# Patient Record
Sex: Female | Born: 1975 | Race: White | Hispanic: No | Marital: Single | Smoking: Current every day smoker
Health system: Southern US, Community
[De-identification: ages and names within clinical notes are randomized; demographics above are authoritative.]

## PROBLEM LIST (undated history)

## (undated) ENCOUNTER — Inpatient Hospital Stay: Payer: Self-pay

## (undated) DIAGNOSIS — I10 Essential (primary) hypertension: Secondary | ICD-10-CM

## (undated) DIAGNOSIS — K3532 Acute appendicitis with perforation and localized peritonitis, without abscess: Secondary | ICD-10-CM

## (undated) DIAGNOSIS — G9520 Unspecified cord compression: Secondary | ICD-10-CM

## (undated) DIAGNOSIS — K37 Unspecified appendicitis: Secondary | ICD-10-CM

## (undated) DIAGNOSIS — I2510 Atherosclerotic heart disease of native coronary artery without angina pectoris: Secondary | ICD-10-CM

## (undated) DIAGNOSIS — G8929 Other chronic pain: Secondary | ICD-10-CM

## (undated) DIAGNOSIS — F32A Depression, unspecified: Secondary | ICD-10-CM

## (undated) DIAGNOSIS — E119 Type 2 diabetes mellitus without complications: Secondary | ICD-10-CM

## (undated) DIAGNOSIS — F329 Major depressive disorder, single episode, unspecified: Secondary | ICD-10-CM

## (undated) DIAGNOSIS — F1410 Cocaine abuse, uncomplicated: Secondary | ICD-10-CM

## (undated) DIAGNOSIS — I219 Acute myocardial infarction, unspecified: Secondary | ICD-10-CM

## (undated) DIAGNOSIS — F419 Anxiety disorder, unspecified: Secondary | ICD-10-CM

## (undated) DIAGNOSIS — Z955 Presence of coronary angioplasty implant and graft: Secondary | ICD-10-CM

## (undated) DIAGNOSIS — M5116 Intervertebral disc disorders with radiculopathy, lumbar region: Secondary | ICD-10-CM

## (undated) HISTORY — DX: Type 2 diabetes mellitus without complications: E11.9

## (undated) HISTORY — DX: Unspecified appendicitis: K37

## (undated) HISTORY — PX: OTHER SURGICAL HISTORY: SHX169

## (undated) HISTORY — DX: Acute appendicitis with perforation, localized peritonitis, and gangrene, without abscess: K35.32

## (undated) HISTORY — DX: Atherosclerotic heart disease of native coronary artery without angina pectoris: I25.10

## (undated) HISTORY — DX: Other chronic pain: G89.29

## (undated) HISTORY — DX: Cocaine abuse, uncomplicated: F14.10

## (undated) HISTORY — DX: Acute myocardial infarction, unspecified: I21.9

## (undated) HISTORY — DX: Unspecified cord compression: G95.20

## (undated) HISTORY — PX: DILATION AND CURETTAGE OF UTERUS: SHX78

## (undated) HISTORY — DX: Intervertebral disc disorders with radiculopathy, lumbar region: M51.16

## (undated) HISTORY — PX: GALLBLADDER SURGERY: SHX652

## (undated) HISTORY — PX: CORONARY ANGIOPLASTY WITH STENT PLACEMENT: SHX49

## (undated) HISTORY — DX: Presence of coronary angioplasty implant and graft: Z95.5

## (undated) HISTORY — PX: TONSILLECTOMY: SUR1361

---

## 2004-04-29 ENCOUNTER — Emergency Department: Payer: Self-pay | Admitting: Emergency Medicine

## 2004-12-03 ENCOUNTER — Emergency Department: Payer: Self-pay | Admitting: General Practice

## 2006-10-10 ENCOUNTER — Emergency Department: Payer: Self-pay | Admitting: Emergency Medicine

## 2007-05-22 ENCOUNTER — Emergency Department: Payer: Self-pay | Admitting: Emergency Medicine

## 2007-05-25 ENCOUNTER — Emergency Department: Payer: Self-pay | Admitting: Emergency Medicine

## 2007-11-28 ENCOUNTER — Emergency Department: Payer: Self-pay | Admitting: Emergency Medicine

## 2007-12-23 ENCOUNTER — Emergency Department: Payer: Self-pay | Admitting: Emergency Medicine

## 2008-02-08 ENCOUNTER — Emergency Department: Payer: Self-pay | Admitting: Internal Medicine

## 2008-05-13 ENCOUNTER — Emergency Department: Payer: Self-pay | Admitting: Emergency Medicine

## 2009-02-12 ENCOUNTER — Inpatient Hospital Stay: Payer: Self-pay | Admitting: Cardiology

## 2009-04-18 ENCOUNTER — Emergency Department: Payer: Self-pay | Admitting: Emergency Medicine

## 2009-06-17 ENCOUNTER — Encounter: Payer: Self-pay | Admitting: Orthopaedic Surgery

## 2009-06-23 ENCOUNTER — Emergency Department: Payer: Self-pay | Admitting: Unknown Physician Specialty

## 2009-06-24 ENCOUNTER — Encounter: Payer: Self-pay | Admitting: Orthopaedic Surgery

## 2009-07-22 ENCOUNTER — Encounter: Payer: Self-pay | Admitting: Orthopaedic Surgery

## 2009-10-24 ENCOUNTER — Emergency Department: Payer: Self-pay | Admitting: Unknown Physician Specialty

## 2010-09-02 ENCOUNTER — Emergency Department: Payer: Self-pay | Admitting: Emergency Medicine

## 2011-08-09 ENCOUNTER — Emergency Department: Payer: Self-pay | Admitting: Emergency Medicine

## 2011-08-09 LAB — CBC WITH DIFFERENTIAL/PLATELET
Basophil #: 0.1 10*3/uL (ref 0.0–0.1)
Eosinophil %: 2.6 %
HCT: 44.2 % (ref 35.0–47.0)
Lymphocyte #: 4.7 10*3/uL — ABNORMAL HIGH (ref 1.0–3.6)
Lymphocyte %: 37.3 %
MCH: 32 pg (ref 26.0–34.0)
MCV: 96 fL (ref 80–100)
Monocyte #: 0.4 10*3/uL (ref 0.0–0.7)
Monocyte %: 3.5 %
Neutrophil #: 7 10*3/uL — ABNORMAL HIGH (ref 1.4–6.5)
Platelet: 230 10*3/uL (ref 150–440)
RBC: 4.62 10*6/uL (ref 3.80–5.20)
WBC: 12.6 10*3/uL — ABNORMAL HIGH (ref 3.6–11.0)

## 2011-08-09 LAB — PREGNANCY, URINE: Pregnancy Test, Urine: NEGATIVE m[IU]/mL

## 2011-08-09 LAB — COMPREHENSIVE METABOLIC PANEL
Albumin: 4 g/dL (ref 3.4–5.0)
Alkaline Phosphatase: 60 U/L (ref 50–136)
Anion Gap: 12 (ref 7–16)
BUN: 10 mg/dL (ref 7–18)
Calcium, Total: 9 mg/dL (ref 8.5–10.1)
Creatinine: 0.78 mg/dL (ref 0.60–1.30)
Glucose: 287 mg/dL — ABNORMAL HIGH (ref 65–99)
Osmolality: 287 (ref 275–301)
Potassium: 4.3 mmol/L (ref 3.5–5.1)
SGPT (ALT): 28 U/L
Sodium: 139 mmol/L (ref 136–145)
Total Protein: 7.8 g/dL (ref 6.4–8.2)

## 2011-08-09 LAB — URINALYSIS, COMPLETE
Glucose,UR: >=500 mg/dL (ref 0–75)
Ketone: NEGATIVE
Leukocyte Esterase: NEGATIVE
Nitrite: NEGATIVE
Ph: 6 (ref 4.5–8.0)
Protein: NEGATIVE
Specific Gravity: 1.015 (ref 1.003–1.030)
WBC UR: 1 /HPF (ref 0–5)

## 2012-03-08 ENCOUNTER — Emergency Department: Payer: Self-pay | Admitting: Emergency Medicine

## 2012-03-13 ENCOUNTER — Emergency Department: Payer: Self-pay | Admitting: Emergency Medicine

## 2013-04-09 DIAGNOSIS — M489 Spondylopathy, unspecified: Secondary | ICD-10-CM | POA: Insufficient documentation

## 2013-04-09 DIAGNOSIS — E119 Type 2 diabetes mellitus without complications: Secondary | ICD-10-CM | POA: Insufficient documentation

## 2013-04-09 DIAGNOSIS — I219 Acute myocardial infarction, unspecified: Secondary | ICD-10-CM

## 2013-04-09 DIAGNOSIS — Z955 Presence of coronary angioplasty implant and graft: Secondary | ICD-10-CM

## 2013-04-09 HISTORY — DX: Type 2 diabetes mellitus without complications: E11.9

## 2013-04-09 HISTORY — DX: Presence of coronary angioplasty implant and graft: Z95.5

## 2013-04-09 HISTORY — DX: Acute myocardial infarction, unspecified: I21.9

## 2013-06-14 DIAGNOSIS — Z720 Tobacco use: Secondary | ICD-10-CM | POA: Insufficient documentation

## 2013-06-14 DIAGNOSIS — F17200 Nicotine dependence, unspecified, uncomplicated: Secondary | ICD-10-CM | POA: Insufficient documentation

## 2013-06-15 DIAGNOSIS — M4802 Spinal stenosis, cervical region: Secondary | ICD-10-CM | POA: Insufficient documentation

## 2013-07-04 DIAGNOSIS — Z981 Arthrodesis status: Secondary | ICD-10-CM | POA: Insufficient documentation

## 2013-07-04 DIAGNOSIS — G9520 Unspecified cord compression: Secondary | ICD-10-CM | POA: Insufficient documentation

## 2013-07-04 DIAGNOSIS — G959 Disease of spinal cord, unspecified: Secondary | ICD-10-CM | POA: Insufficient documentation

## 2013-07-04 HISTORY — DX: Unspecified cord compression: G95.20

## 2013-11-28 DIAGNOSIS — M5090 Cervical disc disorder, unspecified, unspecified cervical region: Secondary | ICD-10-CM | POA: Insufficient documentation

## 2013-11-28 DIAGNOSIS — G8929 Other chronic pain: Secondary | ICD-10-CM

## 2013-11-28 HISTORY — DX: Other chronic pain: G89.29

## 2013-11-30 DIAGNOSIS — M47816 Spondylosis without myelopathy or radiculopathy, lumbar region: Secondary | ICD-10-CM | POA: Insufficient documentation

## 2013-11-30 DIAGNOSIS — M5116 Intervertebral disc disorders with radiculopathy, lumbar region: Secondary | ICD-10-CM

## 2013-11-30 DIAGNOSIS — F419 Anxiety disorder, unspecified: Secondary | ICD-10-CM | POA: Insufficient documentation

## 2013-11-30 HISTORY — DX: Intervertebral disc disorders with radiculopathy, lumbar region: M51.16

## 2014-06-21 DIAGNOSIS — M4696 Unspecified inflammatory spondylopathy, lumbar region: Secondary | ICD-10-CM | POA: Diagnosis not present

## 2014-06-21 DIAGNOSIS — M5090 Cervical disc disorder, unspecified, unspecified cervical region: Secondary | ICD-10-CM | POA: Diagnosis not present

## 2014-06-21 DIAGNOSIS — M5116 Intervertebral disc disorders with radiculopathy, lumbar region: Secondary | ICD-10-CM | POA: Diagnosis not present

## 2014-06-21 DIAGNOSIS — G894 Chronic pain syndrome: Secondary | ICD-10-CM | POA: Diagnosis not present

## 2014-06-21 DIAGNOSIS — F419 Anxiety disorder, unspecified: Secondary | ICD-10-CM | POA: Diagnosis not present

## 2014-06-21 DIAGNOSIS — F329 Major depressive disorder, single episode, unspecified: Secondary | ICD-10-CM | POA: Diagnosis not present

## 2014-08-07 DIAGNOSIS — Z3A00 Weeks of gestation of pregnancy not specified: Secondary | ICD-10-CM | POA: Diagnosis not present

## 2014-08-07 DIAGNOSIS — N898 Other specified noninflammatory disorders of vagina: Secondary | ICD-10-CM | POA: Diagnosis not present

## 2014-08-07 DIAGNOSIS — O3481 Maternal care for other abnormalities of pelvic organs, first trimester: Secondary | ICD-10-CM | POA: Diagnosis not present

## 2014-08-07 DIAGNOSIS — Z3A01 Less than 8 weeks gestation of pregnancy: Secondary | ICD-10-CM | POA: Diagnosis not present

## 2014-08-07 DIAGNOSIS — F17210 Nicotine dependence, cigarettes, uncomplicated: Secondary | ICD-10-CM | POA: Diagnosis not present

## 2014-08-07 DIAGNOSIS — O9989 Other specified diseases and conditions complicating pregnancy, childbirth and the puerperium: Secondary | ICD-10-CM | POA: Diagnosis not present

## 2014-08-07 DIAGNOSIS — E119 Type 2 diabetes mellitus without complications: Secondary | ICD-10-CM | POA: Diagnosis not present

## 2014-08-07 DIAGNOSIS — O26891 Other specified pregnancy related conditions, first trimester: Secondary | ICD-10-CM | POA: Diagnosis not present

## 2014-08-07 DIAGNOSIS — R1030 Lower abdominal pain, unspecified: Secondary | ICD-10-CM | POA: Diagnosis not present

## 2014-08-14 LAB — OB RESULTS CONSOLE HEPATITIS B SURFACE ANTIGEN: HEP B S AG: NEGATIVE

## 2014-08-14 LAB — OB RESULTS CONSOLE VARICELLA ZOSTER ANTIBODY, IGG: VARICELLA IGG: IMMUNE

## 2014-08-14 LAB — OB RESULTS CONSOLE RUBELLA ANTIBODY, IGM: Rubella: IMMUNE

## 2014-08-14 LAB — OB RESULTS CONSOLE HIV ANTIBODY (ROUTINE TESTING): HIV: NONREACTIVE

## 2014-08-14 LAB — OB RESULTS CONSOLE RPR: RPR: NONREACTIVE

## 2014-08-14 LAB — OB RESULTS CONSOLE ABO/RH: RH Type: POSITIVE

## 2014-08-26 DIAGNOSIS — Z3A10 10 weeks gestation of pregnancy: Secondary | ICD-10-CM | POA: Diagnosis not present

## 2014-08-26 DIAGNOSIS — O09522 Supervision of elderly multigravida, second trimester: Secondary | ICD-10-CM | POA: Diagnosis not present

## 2014-08-27 DIAGNOSIS — O0990 Supervision of high risk pregnancy, unspecified, unspecified trimester: Secondary | ICD-10-CM | POA: Diagnosis not present

## 2014-08-29 DIAGNOSIS — O9989 Other specified diseases and conditions complicating pregnancy, childbirth and the puerperium: Secondary | ICD-10-CM | POA: Diagnosis not present

## 2014-08-29 DIAGNOSIS — Z118 Encounter for screening for other infectious and parasitic diseases: Secondary | ICD-10-CM | POA: Diagnosis not present

## 2014-08-29 DIAGNOSIS — Z3A11 11 weeks gestation of pregnancy: Secondary | ICD-10-CM | POA: Diagnosis not present

## 2014-08-29 DIAGNOSIS — Z8659 Personal history of other mental and behavioral disorders: Secondary | ICD-10-CM | POA: Diagnosis not present

## 2014-08-29 DIAGNOSIS — Z7982 Long term (current) use of aspirin: Secondary | ICD-10-CM | POA: Diagnosis not present

## 2014-08-29 DIAGNOSIS — O99351 Diseases of the nervous system complicating pregnancy, first trimester: Secondary | ICD-10-CM | POA: Diagnosis not present

## 2014-08-29 DIAGNOSIS — Z3A12 12 weeks gestation of pregnancy: Secondary | ICD-10-CM | POA: Diagnosis not present

## 2014-08-29 DIAGNOSIS — N898 Other specified noninflammatory disorders of vagina: Secondary | ICD-10-CM | POA: Diagnosis not present

## 2014-08-29 DIAGNOSIS — O09521 Supervision of elderly multigravida, first trimester: Secondary | ICD-10-CM | POA: Diagnosis not present

## 2014-08-29 DIAGNOSIS — O161 Unspecified maternal hypertension, first trimester: Secondary | ICD-10-CM | POA: Diagnosis not present

## 2014-08-29 DIAGNOSIS — G8929 Other chronic pain: Secondary | ICD-10-CM | POA: Diagnosis not present

## 2014-08-29 DIAGNOSIS — I252 Old myocardial infarction: Secondary | ICD-10-CM | POA: Insufficient documentation

## 2014-08-29 DIAGNOSIS — O24111 Pre-existing diabetes mellitus, type 2, in pregnancy, first trimester: Secondary | ICD-10-CM | POA: Diagnosis not present

## 2014-08-29 DIAGNOSIS — Z113 Encounter for screening for infections with a predominantly sexual mode of transmission: Secondary | ICD-10-CM | POA: Diagnosis not present

## 2014-08-29 DIAGNOSIS — E119 Type 2 diabetes mellitus without complications: Secondary | ICD-10-CM | POA: Diagnosis not present

## 2014-08-29 DIAGNOSIS — O99411 Diseases of the circulatory system complicating pregnancy, first trimester: Secondary | ICD-10-CM | POA: Diagnosis not present

## 2014-08-30 DIAGNOSIS — M5116 Intervertebral disc disorders with radiculopathy, lumbar region: Secondary | ICD-10-CM | POA: Diagnosis not present

## 2014-08-30 DIAGNOSIS — M4696 Unspecified inflammatory spondylopathy, lumbar region: Secondary | ICD-10-CM | POA: Diagnosis not present

## 2014-08-30 DIAGNOSIS — G894 Chronic pain syndrome: Secondary | ICD-10-CM | POA: Diagnosis not present

## 2014-09-03 DIAGNOSIS — E119 Type 2 diabetes mellitus without complications: Secondary | ICD-10-CM | POA: Diagnosis not present

## 2014-09-03 DIAGNOSIS — I2510 Atherosclerotic heart disease of native coronary artery without angina pectoris: Secondary | ICD-10-CM | POA: Diagnosis not present

## 2014-09-03 DIAGNOSIS — Z3A12 12 weeks gestation of pregnancy: Secondary | ICD-10-CM | POA: Diagnosis not present

## 2014-09-03 DIAGNOSIS — Z3A11 11 weeks gestation of pregnancy: Secondary | ICD-10-CM | POA: Diagnosis not present

## 2014-09-03 DIAGNOSIS — I252 Old myocardial infarction: Secondary | ICD-10-CM | POA: Diagnosis not present

## 2014-09-03 DIAGNOSIS — F419 Anxiety disorder, unspecified: Secondary | ICD-10-CM | POA: Diagnosis not present

## 2014-09-03 DIAGNOSIS — O200 Threatened abortion: Secondary | ICD-10-CM | POA: Diagnosis not present

## 2014-09-03 DIAGNOSIS — Z3A01 Less than 8 weeks gestation of pregnancy: Secondary | ICD-10-CM | POA: Diagnosis not present

## 2014-09-03 DIAGNOSIS — O208 Other hemorrhage in early pregnancy: Secondary | ICD-10-CM | POA: Diagnosis not present

## 2014-09-03 DIAGNOSIS — R102 Pelvic and perineal pain: Secondary | ICD-10-CM | POA: Diagnosis not present

## 2014-09-03 DIAGNOSIS — F17210 Nicotine dependence, cigarettes, uncomplicated: Secondary | ICD-10-CM | POA: Diagnosis not present

## 2014-09-03 DIAGNOSIS — I10 Essential (primary) hypertension: Secondary | ICD-10-CM | POA: Diagnosis not present

## 2014-09-04 DIAGNOSIS — E119 Type 2 diabetes mellitus without complications: Secondary | ICD-10-CM | POA: Diagnosis not present

## 2014-09-04 DIAGNOSIS — O99351 Diseases of the nervous system complicating pregnancy, first trimester: Secondary | ICD-10-CM | POA: Diagnosis not present

## 2014-09-04 DIAGNOSIS — O09521 Supervision of elderly multigravida, first trimester: Secondary | ICD-10-CM | POA: Diagnosis not present

## 2014-09-04 DIAGNOSIS — O10019 Pre-existing essential hypertension complicating pregnancy, unspecified trimester: Secondary | ICD-10-CM | POA: Insufficient documentation

## 2014-09-04 DIAGNOSIS — O10919 Unspecified pre-existing hypertension complicating pregnancy, unspecified trimester: Secondary | ICD-10-CM | POA: Insufficient documentation

## 2014-09-04 DIAGNOSIS — O09519 Supervision of elderly primigravida, unspecified trimester: Secondary | ICD-10-CM | POA: Insufficient documentation

## 2014-09-04 DIAGNOSIS — G8921 Chronic pain due to trauma: Secondary | ICD-10-CM | POA: Insufficient documentation

## 2014-09-04 DIAGNOSIS — I252 Old myocardial infarction: Secondary | ICD-10-CM | POA: Diagnosis not present

## 2014-09-04 DIAGNOSIS — O99411 Diseases of the circulatory system complicating pregnancy, first trimester: Secondary | ICD-10-CM | POA: Diagnosis not present

## 2014-09-04 DIAGNOSIS — O24111 Pre-existing diabetes mellitus, type 2, in pregnancy, first trimester: Secondary | ICD-10-CM | POA: Diagnosis not present

## 2014-10-02 DIAGNOSIS — I2510 Atherosclerotic heart disease of native coronary artery without angina pectoris: Secondary | ICD-10-CM

## 2014-10-02 DIAGNOSIS — Z955 Presence of coronary angioplasty implant and graft: Secondary | ICD-10-CM | POA: Insufficient documentation

## 2014-10-02 HISTORY — DX: Atherosclerotic heart disease of native coronary artery without angina pectoris: I25.10

## 2014-10-03 DIAGNOSIS — O99332 Smoking (tobacco) complicating pregnancy, second trimester: Secondary | ICD-10-CM | POA: Diagnosis not present

## 2014-10-03 DIAGNOSIS — Z7982 Long term (current) use of aspirin: Secondary | ICD-10-CM | POA: Diagnosis not present

## 2014-10-03 DIAGNOSIS — F329 Major depressive disorder, single episode, unspecified: Secondary | ICD-10-CM | POA: Diagnosis not present

## 2014-10-03 DIAGNOSIS — I2510 Atherosclerotic heart disease of native coronary artery without angina pectoris: Secondary | ICD-10-CM | POA: Diagnosis not present

## 2014-10-03 DIAGNOSIS — O99352 Diseases of the nervous system complicating pregnancy, second trimester: Secondary | ICD-10-CM | POA: Diagnosis not present

## 2014-10-03 DIAGNOSIS — Z794 Long term (current) use of insulin: Secondary | ICD-10-CM | POA: Diagnosis not present

## 2014-10-03 DIAGNOSIS — O99342 Other mental disorders complicating pregnancy, second trimester: Secondary | ICD-10-CM | POA: Diagnosis not present

## 2014-10-03 DIAGNOSIS — O162 Unspecified maternal hypertension, second trimester: Secondary | ICD-10-CM | POA: Diagnosis not present

## 2014-10-03 DIAGNOSIS — G8929 Other chronic pain: Secondary | ICD-10-CM | POA: Diagnosis not present

## 2014-10-03 DIAGNOSIS — I252 Old myocardial infarction: Secondary | ICD-10-CM | POA: Diagnosis not present

## 2014-10-03 DIAGNOSIS — O09522 Supervision of elderly multigravida, second trimester: Secondary | ICD-10-CM | POA: Diagnosis not present

## 2014-10-03 DIAGNOSIS — F419 Anxiety disorder, unspecified: Secondary | ICD-10-CM | POA: Diagnosis not present

## 2014-10-03 DIAGNOSIS — E119 Type 2 diabetes mellitus without complications: Secondary | ICD-10-CM | POA: Diagnosis not present

## 2014-10-03 DIAGNOSIS — O99412 Diseases of the circulatory system complicating pregnancy, second trimester: Secondary | ICD-10-CM | POA: Diagnosis not present

## 2014-10-03 DIAGNOSIS — Z3A16 16 weeks gestation of pregnancy: Secondary | ICD-10-CM | POA: Diagnosis not present

## 2014-10-03 DIAGNOSIS — F17210 Nicotine dependence, cigarettes, uncomplicated: Secondary | ICD-10-CM | POA: Diagnosis not present

## 2014-10-03 DIAGNOSIS — O24112 Pre-existing diabetes mellitus, type 2, in pregnancy, second trimester: Secondary | ICD-10-CM | POA: Diagnosis not present

## 2014-10-10 DIAGNOSIS — Z3A17 17 weeks gestation of pregnancy: Secondary | ICD-10-CM | POA: Diagnosis not present

## 2014-10-10 DIAGNOSIS — O99342 Other mental disorders complicating pregnancy, second trimester: Secondary | ICD-10-CM | POA: Diagnosis not present

## 2014-10-10 DIAGNOSIS — I252 Old myocardial infarction: Secondary | ICD-10-CM | POA: Diagnosis not present

## 2014-10-10 DIAGNOSIS — O26892 Other specified pregnancy related conditions, second trimester: Secondary | ICD-10-CM | POA: Diagnosis not present

## 2014-10-10 DIAGNOSIS — Z7982 Long term (current) use of aspirin: Secondary | ICD-10-CM | POA: Diagnosis not present

## 2014-10-10 DIAGNOSIS — E119 Type 2 diabetes mellitus without complications: Secondary | ICD-10-CM | POA: Diagnosis not present

## 2014-10-10 DIAGNOSIS — O24912 Unspecified diabetes mellitus in pregnancy, second trimester: Secondary | ICD-10-CM | POA: Diagnosis not present

## 2014-10-10 DIAGNOSIS — O162 Unspecified maternal hypertension, second trimester: Secondary | ICD-10-CM | POA: Diagnosis not present

## 2014-10-10 DIAGNOSIS — O09522 Supervision of elderly multigravida, second trimester: Secondary | ICD-10-CM | POA: Diagnosis not present

## 2014-10-10 DIAGNOSIS — F419 Anxiety disorder, unspecified: Secondary | ICD-10-CM | POA: Diagnosis not present

## 2014-10-10 DIAGNOSIS — G8929 Other chronic pain: Secondary | ICD-10-CM | POA: Diagnosis not present

## 2014-10-10 DIAGNOSIS — I2510 Atherosclerotic heart disease of native coronary artery without angina pectoris: Secondary | ICD-10-CM | POA: Diagnosis not present

## 2014-10-10 DIAGNOSIS — O99332 Smoking (tobacco) complicating pregnancy, second trimester: Secondary | ICD-10-CM | POA: Diagnosis not present

## 2014-10-10 DIAGNOSIS — Z794 Long term (current) use of insulin: Secondary | ICD-10-CM | POA: Diagnosis not present

## 2014-10-10 DIAGNOSIS — Z79891 Long term (current) use of opiate analgesic: Secondary | ICD-10-CM | POA: Diagnosis not present

## 2014-10-10 DIAGNOSIS — Z955 Presence of coronary angioplasty implant and graft: Secondary | ICD-10-CM | POA: Diagnosis not present

## 2014-10-10 DIAGNOSIS — O99412 Diseases of the circulatory system complicating pregnancy, second trimester: Secondary | ICD-10-CM | POA: Diagnosis not present

## 2014-10-10 DIAGNOSIS — F329 Major depressive disorder, single episode, unspecified: Secondary | ICD-10-CM | POA: Diagnosis not present

## 2014-10-11 DIAGNOSIS — R7989 Other specified abnormal findings of blood chemistry: Secondary | ICD-10-CM | POA: Insufficient documentation

## 2014-10-11 DIAGNOSIS — R799 Abnormal finding of blood chemistry, unspecified: Secondary | ICD-10-CM | POA: Insufficient documentation

## 2014-10-22 DIAGNOSIS — O358XX0 Maternal care for other (suspected) fetal abnormality and damage, not applicable or unspecified: Secondary | ICD-10-CM | POA: Insufficient documentation

## 2014-10-22 DIAGNOSIS — F419 Anxiety disorder, unspecified: Secondary | ICD-10-CM

## 2014-10-22 DIAGNOSIS — O09519 Supervision of elderly primigravida, unspecified trimester: Secondary | ICD-10-CM | POA: Diagnosis not present

## 2014-10-22 DIAGNOSIS — O10912 Unspecified pre-existing hypertension complicating pregnancy, second trimester: Secondary | ICD-10-CM | POA: Diagnosis not present

## 2014-10-22 DIAGNOSIS — F329 Major depressive disorder, single episode, unspecified: Secondary | ICD-10-CM | POA: Insufficient documentation

## 2014-10-22 DIAGNOSIS — O2620 Pregnancy care for patient with recurrent pregnancy loss, unspecified trimester: Secondary | ICD-10-CM | POA: Insufficient documentation

## 2014-10-22 DIAGNOSIS — E1169 Type 2 diabetes mellitus with other specified complication: Secondary | ICD-10-CM | POA: Diagnosis not present

## 2014-10-22 DIAGNOSIS — G8921 Chronic pain due to trauma: Secondary | ICD-10-CM | POA: Diagnosis not present

## 2014-10-22 DIAGNOSIS — O99330 Smoking (tobacco) complicating pregnancy, unspecified trimester: Secondary | ICD-10-CM | POA: Insufficient documentation

## 2014-10-23 DIAGNOSIS — R7989 Other specified abnormal findings of blood chemistry: Secondary | ICD-10-CM | POA: Diagnosis not present

## 2014-11-06 DIAGNOSIS — M4696 Unspecified inflammatory spondylopathy, lumbar region: Secondary | ICD-10-CM | POA: Diagnosis not present

## 2014-11-06 DIAGNOSIS — M5116 Intervertebral disc disorders with radiculopathy, lumbar region: Secondary | ICD-10-CM | POA: Diagnosis not present

## 2014-11-21 DIAGNOSIS — O358XX0 Maternal care for other (suspected) fetal abnormality and damage, not applicable or unspecified: Secondary | ICD-10-CM | POA: Diagnosis not present

## 2014-11-21 DIAGNOSIS — G8921 Chronic pain due to trauma: Secondary | ICD-10-CM | POA: Diagnosis not present

## 2014-11-21 DIAGNOSIS — O99330 Smoking (tobacco) complicating pregnancy, unspecified trimester: Secondary | ICD-10-CM | POA: Diagnosis not present

## 2014-11-21 DIAGNOSIS — O10919 Unspecified pre-existing hypertension complicating pregnancy, unspecified trimester: Secondary | ICD-10-CM | POA: Diagnosis not present

## 2014-11-21 DIAGNOSIS — R7989 Other specified abnormal findings of blood chemistry: Secondary | ICD-10-CM | POA: Diagnosis not present

## 2014-11-21 DIAGNOSIS — E119 Type 2 diabetes mellitus without complications: Secondary | ICD-10-CM | POA: Diagnosis not present

## 2014-11-21 DIAGNOSIS — O2620 Pregnancy care for patient with recurrent pregnancy loss, unspecified trimester: Secondary | ICD-10-CM | POA: Diagnosis not present

## 2014-11-21 DIAGNOSIS — Z3A23 23 weeks gestation of pregnancy: Secondary | ICD-10-CM | POA: Diagnosis not present

## 2014-11-21 DIAGNOSIS — I252 Old myocardial infarction: Secondary | ICD-10-CM | POA: Diagnosis not present

## 2014-11-21 DIAGNOSIS — O09519 Supervision of elderly primigravida, unspecified trimester: Secondary | ICD-10-CM | POA: Diagnosis not present

## 2014-12-13 DIAGNOSIS — O2622 Pregnancy care for patient with recurrent pregnancy loss, second trimester: Secondary | ICD-10-CM | POA: Diagnosis not present

## 2014-12-13 DIAGNOSIS — O99412 Diseases of the circulatory system complicating pregnancy, second trimester: Secondary | ICD-10-CM | POA: Diagnosis not present

## 2014-12-13 DIAGNOSIS — I2510 Atherosclerotic heart disease of native coronary artery without angina pectoris: Secondary | ICD-10-CM | POA: Diagnosis not present

## 2014-12-13 DIAGNOSIS — O358XX0 Maternal care for other (suspected) fetal abnormality and damage, not applicable or unspecified: Secondary | ICD-10-CM | POA: Diagnosis not present

## 2014-12-13 DIAGNOSIS — Z79899 Other long term (current) drug therapy: Secondary | ICD-10-CM | POA: Diagnosis not present

## 2014-12-13 DIAGNOSIS — O0932 Supervision of pregnancy with insufficient antenatal care, second trimester: Secondary | ICD-10-CM | POA: Diagnosis not present

## 2014-12-13 DIAGNOSIS — E119 Type 2 diabetes mellitus without complications: Secondary | ICD-10-CM | POA: Diagnosis not present

## 2014-12-13 DIAGNOSIS — O24112 Pre-existing diabetes mellitus, type 2, in pregnancy, second trimester: Secondary | ICD-10-CM | POA: Diagnosis not present

## 2014-12-13 DIAGNOSIS — Z3A26 26 weeks gestation of pregnancy: Secondary | ICD-10-CM | POA: Diagnosis not present

## 2014-12-13 DIAGNOSIS — Z794 Long term (current) use of insulin: Secondary | ICD-10-CM | POA: Diagnosis not present

## 2014-12-13 DIAGNOSIS — Z955 Presence of coronary angioplasty implant and graft: Secondary | ICD-10-CM | POA: Diagnosis not present

## 2014-12-13 DIAGNOSIS — O09522 Supervision of elderly multigravida, second trimester: Secondary | ICD-10-CM | POA: Diagnosis not present

## 2014-12-13 DIAGNOSIS — O99352 Diseases of the nervous system complicating pregnancy, second trimester: Secondary | ICD-10-CM | POA: Diagnosis not present

## 2014-12-13 DIAGNOSIS — Z7982 Long term (current) use of aspirin: Secondary | ICD-10-CM | POA: Diagnosis not present

## 2014-12-13 DIAGNOSIS — O10912 Unspecified pre-existing hypertension complicating pregnancy, second trimester: Secondary | ICD-10-CM | POA: Diagnosis not present

## 2014-12-13 DIAGNOSIS — G8921 Chronic pain due to trauma: Secondary | ICD-10-CM | POA: Diagnosis not present

## 2014-12-17 DIAGNOSIS — O358XX0 Maternal care for other (suspected) fetal abnormality and damage, not applicable or unspecified: Secondary | ICD-10-CM | POA: Diagnosis not present

## 2014-12-17 DIAGNOSIS — H52203 Unspecified astigmatism, bilateral: Secondary | ICD-10-CM | POA: Diagnosis not present

## 2014-12-17 DIAGNOSIS — O2622 Pregnancy care for patient with recurrent pregnancy loss, second trimester: Secondary | ICD-10-CM | POA: Diagnosis not present

## 2014-12-17 DIAGNOSIS — H5213 Myopia, bilateral: Secondary | ICD-10-CM | POA: Diagnosis not present

## 2014-12-17 DIAGNOSIS — O24919 Unspecified diabetes mellitus in pregnancy, unspecified trimester: Secondary | ICD-10-CM | POA: Insufficient documentation

## 2014-12-17 DIAGNOSIS — O24912 Unspecified diabetes mellitus in pregnancy, second trimester: Secondary | ICD-10-CM | POA: Diagnosis not present

## 2014-12-17 DIAGNOSIS — E119 Type 2 diabetes mellitus without complications: Secondary | ICD-10-CM | POA: Diagnosis not present

## 2014-12-17 DIAGNOSIS — O09519 Supervision of elderly primigravida, unspecified trimester: Secondary | ICD-10-CM | POA: Diagnosis not present

## 2014-12-18 DIAGNOSIS — R946 Abnormal results of thyroid function studies: Secondary | ICD-10-CM | POA: Diagnosis not present

## 2014-12-18 DIAGNOSIS — Z0189 Encounter for other specified special examinations: Secondary | ICD-10-CM | POA: Diagnosis not present

## 2014-12-18 DIAGNOSIS — E119 Type 2 diabetes mellitus without complications: Secondary | ICD-10-CM | POA: Diagnosis not present

## 2014-12-18 DIAGNOSIS — O2620 Pregnancy care for patient with recurrent pregnancy loss, unspecified trimester: Secondary | ICD-10-CM | POA: Diagnosis not present

## 2015-01-03 DIAGNOSIS — M4696 Unspecified inflammatory spondylopathy, lumbar region: Secondary | ICD-10-CM | POA: Diagnosis not present

## 2015-01-03 DIAGNOSIS — O09513 Supervision of elderly primigravida, third trimester: Secondary | ICD-10-CM | POA: Diagnosis not present

## 2015-01-03 DIAGNOSIS — G894 Chronic pain syndrome: Secondary | ICD-10-CM | POA: Diagnosis not present

## 2015-01-03 DIAGNOSIS — M5116 Intervertebral disc disorders with radiculopathy, lumbar region: Secondary | ICD-10-CM | POA: Diagnosis not present

## 2015-01-12 ENCOUNTER — Observation Stay: Payer: Medicare Other | Admitting: Anesthesiology

## 2015-01-12 ENCOUNTER — Observation Stay: Payer: Medicare Other

## 2015-01-12 ENCOUNTER — Encounter
Admission: EM | Disposition: A | Discharge status: Other Acute Inpatient Hospital | Payer: Self-pay | Source: Home / Self Care

## 2015-01-12 ENCOUNTER — Observation Stay
Admission: EM | Admit: 2015-01-12 | Discharge: 2015-01-13 | Payer: Medicare Other | Attending: Obstetrics and Gynecology | Admitting: Obstetrics and Gynecology

## 2015-01-12 DIAGNOSIS — F17200 Nicotine dependence, unspecified, uncomplicated: Secondary | ICD-10-CM | POA: Insufficient documentation

## 2015-01-12 DIAGNOSIS — O26899 Other specified pregnancy related conditions, unspecified trimester: Secondary | ICD-10-CM

## 2015-01-12 DIAGNOSIS — R1031 Right lower quadrant pain: Secondary | ICD-10-CM | POA: Insufficient documentation

## 2015-01-12 DIAGNOSIS — M542 Cervicalgia: Secondary | ICD-10-CM | POA: Diagnosis not present

## 2015-01-12 DIAGNOSIS — Z3A30 30 weeks gestation of pregnancy: Secondary | ICD-10-CM | POA: Insufficient documentation

## 2015-01-12 DIAGNOSIS — K3532 Acute appendicitis with perforation and localized peritonitis, without abscess: Secondary | ICD-10-CM | POA: Insufficient documentation

## 2015-01-12 DIAGNOSIS — Z955 Presence of coronary angioplasty implant and graft: Secondary | ICD-10-CM | POA: Diagnosis not present

## 2015-01-12 DIAGNOSIS — Z3A38 38 weeks gestation of pregnancy: Secondary | ICD-10-CM | POA: Diagnosis not present

## 2015-01-12 DIAGNOSIS — K3580 Unspecified acute appendicitis: Secondary | ICD-10-CM | POA: Diagnosis not present

## 2015-01-12 DIAGNOSIS — E669 Obesity, unspecified: Secondary | ICD-10-CM | POA: Insufficient documentation

## 2015-01-12 DIAGNOSIS — F419 Anxiety disorder, unspecified: Secondary | ICD-10-CM | POA: Diagnosis not present

## 2015-01-12 DIAGNOSIS — F1120 Opioid dependence, uncomplicated: Secondary | ICD-10-CM | POA: Diagnosis not present

## 2015-01-12 DIAGNOSIS — O99613 Diseases of the digestive system complicating pregnancy, third trimester: Secondary | ICD-10-CM | POA: Diagnosis present

## 2015-01-12 DIAGNOSIS — F329 Major depressive disorder, single episode, unspecified: Secondary | ICD-10-CM | POA: Diagnosis not present

## 2015-01-12 DIAGNOSIS — O163 Unspecified maternal hypertension, third trimester: Secondary | ICD-10-CM | POA: Diagnosis not present

## 2015-01-12 DIAGNOSIS — Z6831 Body mass index (BMI) 31.0-31.9, adult: Secondary | ICD-10-CM | POA: Insufficient documentation

## 2015-01-12 DIAGNOSIS — E119 Type 2 diabetes mellitus without complications: Secondary | ICD-10-CM | POA: Diagnosis not present

## 2015-01-12 DIAGNOSIS — K352 Acute appendicitis with generalized peritonitis: Secondary | ICD-10-CM | POA: Diagnosis not present

## 2015-01-12 DIAGNOSIS — I252 Old myocardial infarction: Secondary | ICD-10-CM | POA: Diagnosis not present

## 2015-01-12 DIAGNOSIS — O99343 Other mental disorders complicating pregnancy, third trimester: Secondary | ICD-10-CM | POA: Diagnosis not present

## 2015-01-12 DIAGNOSIS — G8921 Chronic pain due to trauma: Secondary | ICD-10-CM | POA: Insufficient documentation

## 2015-01-12 DIAGNOSIS — O26893 Other specified pregnancy related conditions, third trimester: Secondary | ICD-10-CM | POA: Insufficient documentation

## 2015-01-12 DIAGNOSIS — Z981 Arthrodesis status: Secondary | ICD-10-CM

## 2015-01-12 DIAGNOSIS — R109 Unspecified abdominal pain: Secondary | ICD-10-CM

## 2015-01-12 DIAGNOSIS — R102 Pelvic and perineal pain: Secondary | ICD-10-CM | POA: Insufficient documentation

## 2015-01-12 DIAGNOSIS — D72829 Elevated white blood cell count, unspecified: Secondary | ICD-10-CM

## 2015-01-12 DIAGNOSIS — I10 Essential (primary) hypertension: Secondary | ICD-10-CM | POA: Diagnosis not present

## 2015-01-12 DIAGNOSIS — R188 Other ascites: Secondary | ICD-10-CM | POA: Diagnosis not present

## 2015-01-12 DIAGNOSIS — O24913 Unspecified diabetes mellitus in pregnancy, third trimester: Secondary | ICD-10-CM | POA: Insufficient documentation

## 2015-01-12 DIAGNOSIS — O10013 Pre-existing essential hypertension complicating pregnancy, third trimester: Secondary | ICD-10-CM | POA: Diagnosis not present

## 2015-01-12 HISTORY — DX: Anxiety disorder, unspecified: F41.9

## 2015-01-12 HISTORY — DX: Acute myocardial infarction, unspecified: I21.9

## 2015-01-12 HISTORY — DX: Type 2 diabetes mellitus without complications: E11.9

## 2015-01-12 HISTORY — PX: APPENDECTOMY: SHX54

## 2015-01-12 HISTORY — DX: Depression, unspecified: F32.A

## 2015-01-12 HISTORY — DX: Major depressive disorder, single episode, unspecified: F32.9

## 2015-01-12 HISTORY — DX: Essential (primary) hypertension: I10

## 2015-01-12 LAB — COMPREHENSIVE METABOLIC PANEL
ALT: 15 U/L (ref 14–54)
ANION GAP: 11 (ref 5–15)
AST: 25 U/L (ref 15–41)
Albumin: 3.1 g/dL — ABNORMAL LOW (ref 3.5–5.0)
Alkaline Phosphatase: 102 U/L (ref 38–126)
BILIRUBIN TOTAL: 0.2 mg/dL — AB (ref 0.3–1.2)
BUN: 8 mg/dL (ref 6–20)
CO2: 19 mmol/L — ABNORMAL LOW (ref 22–32)
Calcium: 9.2 mg/dL (ref 8.9–10.3)
Chloride: 104 mmol/L (ref 101–111)
Creatinine, Ser: 0.7 mg/dL (ref 0.44–1.00)
Glucose, Bld: 207 mg/dL — ABNORMAL HIGH (ref 65–99)
POTASSIUM: 4 mmol/L (ref 3.5–5.1)
Sodium: 134 mmol/L — ABNORMAL LOW (ref 135–145)
TOTAL PROTEIN: 7.3 g/dL (ref 6.5–8.1)

## 2015-01-12 LAB — URINE DRUG SCREEN, QUALITATIVE (ARMC ONLY)
AMPHETAMINES, UR SCREEN: NOT DETECTED
BENZODIAZEPINE, UR SCRN: NOT DETECTED
Barbiturates, Ur Screen: NOT DETECTED
COCAINE METABOLITE, UR ~~LOC~~: POSITIVE — AB
Cannabinoid 50 Ng, Ur ~~LOC~~: NOT DETECTED
MDMA (ECSTASY) UR SCREEN: NOT DETECTED
Methadone Scn, Ur: NOT DETECTED
OPIATE, UR SCREEN: NOT DETECTED
PHENCYCLIDINE (PCP) UR S: NOT DETECTED
Tricyclic, Ur Screen: NOT DETECTED

## 2015-01-12 LAB — PROTEIN / CREATININE RATIO, URINE
Creatinine, Urine: 326 mg/dL
PROTEIN CREATININE RATIO: 0.46 mg/mg{creat} — AB (ref 0.00–0.15)
Total Protein, Urine: 149 mg/dL

## 2015-01-12 LAB — CBC
HEMATOCRIT: 42.3 % (ref 35.0–47.0)
HEMOGLOBIN: 14.1 g/dL (ref 12.0–16.0)
MCH: 31 pg (ref 26.0–34.0)
MCHC: 33.4 g/dL (ref 32.0–36.0)
MCV: 92.8 fL (ref 80.0–100.0)
Platelets: 398 10*3/uL (ref 150–440)
RBC: 4.56 MIL/uL (ref 3.80–5.20)
RDW: 12.5 % (ref 11.5–14.5)
WBC: 17.7 10*3/uL — AB (ref 3.6–11.0)

## 2015-01-12 LAB — URINALYSIS COMPLETE WITH MICROSCOPIC (ARMC ONLY)
BACTERIA UA: NONE SEEN
Bilirubin Urine: NEGATIVE
Glucose, UA: 50 mg/dL — AB
HGB URINE DIPSTICK: NEGATIVE
Nitrite: NEGATIVE
PH: 5 (ref 5.0–8.0)
PROTEIN: 100 mg/dL — AB
Specific Gravity, Urine: 1.024 (ref 1.005–1.030)

## 2015-01-12 LAB — KLEIHAUER-BETKE STAIN
Fetal Cells %: 0 %
Quantitation Fetal Hemoglobin: 0 mL

## 2015-01-12 LAB — ABO/RH: ABO/RH(D): O POS

## 2015-01-12 LAB — GLUCOSE, CAPILLARY
GLUCOSE-CAPILLARY: 233 mg/dL — AB (ref 65–99)
Glucose-Capillary: 191 mg/dL — ABNORMAL HIGH (ref 65–99)

## 2015-01-12 LAB — LIPASE, BLOOD: LIPASE: 16 U/L — AB (ref 22–51)

## 2015-01-12 SURGERY — APPENDECTOMY
Anesthesia: General

## 2015-01-12 SURGERY — APPENDECTOMY, LAPAROSCOPIC
Anesthesia: General

## 2015-01-12 MED ORDER — SODIUM CHLORIDE 0.9 % IV SOLN
INTRAVENOUS | Status: DC
  Administered 2015-01-12: 125 mL/h via INTRAVENOUS
  Administered 2015-01-12: 15:00:00 via INTRAVENOUS

## 2015-01-12 MED ORDER — INSULIN ASPART 100 UNIT/ML ~~LOC~~ SOLN
0.0000 [IU] | Freq: Every day | SUBCUTANEOUS | Status: DC
  Administered 2015-01-12: 2 [IU] via SUBCUTANEOUS
  Filled 2015-01-12 (×6): qty 0.05
  Filled 2015-01-12: qty 2

## 2015-01-12 MED ORDER — MORPHINE SULFATE (PF) 4 MG/ML IV SOLN
INTRAVENOUS | Status: AC
  Administered 2015-01-12: 4 mg via INTRAVENOUS
  Filled 2015-01-12: qty 1

## 2015-01-12 MED ORDER — IOHEXOL 300 MG/ML  SOLN
100.0000 mL | Freq: Once | INTRAMUSCULAR | Status: AC | PRN
  Administered 2015-01-12: 100 mL via INTRAVENOUS

## 2015-01-12 MED ORDER — IOHEXOL 240 MG/ML SOLN
25.0000 mL | INTRAMUSCULAR | Status: AC
  Administered 2015-01-12 (×2): 25 mL via ORAL

## 2015-01-12 MED ORDER — MORPHINE SULFATE (PF) 4 MG/ML IV SOLN
4.0000 mg | Freq: Once | INTRAVENOUS | Status: AC
  Administered 2015-01-12: 4 mg via INTRAVENOUS

## 2015-01-12 MED ORDER — INSULIN ASPART 100 UNIT/ML ~~LOC~~ SOLN
0.0000 [IU] | Freq: Three times a day (TID) | SUBCUTANEOUS | Status: DC
  Administered 2015-01-12: 5 [IU] via SUBCUTANEOUS
  Filled 2015-01-12 (×4): qty 0.15
  Filled 2015-01-12: qty 5
  Filled 2015-01-12 (×10): qty 0.15

## 2015-01-12 MED ORDER — LORAZEPAM 2 MG/ML IJ SOLN
1.0000 mg | Freq: Once | INTRAMUSCULAR | Status: AC | PRN
  Administered 2015-01-12: 1 mg via INTRAVENOUS
  Filled 2015-01-12: qty 0.5

## 2015-01-12 MED ORDER — NIFEDIPINE 10 MG PO CAPS
10.0000 mg | ORAL_CAPSULE | Freq: Four times a day (QID) | ORAL | Status: DC
  Administered 2015-01-12 – 2015-01-13 (×2): 10 mg via ORAL
  Filled 2015-01-12 (×9): qty 1

## 2015-01-12 MED ORDER — NIFEDIPINE 10 MG PO CAPS
10.0000 mg | ORAL_CAPSULE | ORAL | Status: AC
  Administered 2015-01-12 (×4): 10 mg via ORAL
  Filled 2015-01-12 (×4): qty 1

## 2015-01-12 MED ORDER — CEFOXITIN SODIUM-DEXTROSE 2-2.2 GM-% IV SOLR (PREMIX)
INTRAVENOUS | Status: AC
  Administered 2015-01-12: 2000 mg via INTRAVENOUS
  Filled 2015-01-12: qty 50

## 2015-01-12 MED ORDER — CEFOXITIN SODIUM-DEXTROSE 2-2.2 GM-% IV SOLR (PREMIX)
2.0000 g | Freq: Four times a day (QID) | INTRAVENOUS | Status: DC
  Administered 2015-01-12 – 2015-01-13 (×2): 2000 mg via INTRAVENOUS
  Filled 2015-01-12: qty 50

## 2015-01-12 MED ORDER — SODIUM CHLORIDE 0.9 % IJ SOLN
INTRAMUSCULAR | Status: AC
  Administered 2015-01-12: 23:00:00
  Filled 2015-01-12: qty 6

## 2015-01-12 SURGICAL SUPPLY — 42 items
APPLIER CLIP ROT 10 11.4 M/L (STAPLE)
BAG COUNTER SPONGE EZ (MISCELLANEOUS) IMPLANT
BLADE SURG SZ11 CARB STEEL (BLADE) IMPLANT
CANISTER SUCT 1200ML W/VALVE (MISCELLANEOUS) IMPLANT
CATH TRAY 16F METER LATEX (MISCELLANEOUS) IMPLANT
CHLORAPREP W/TINT 26ML (MISCELLANEOUS) IMPLANT
CLIP APPLIE ROT 10 11.4 M/L (STAPLE) IMPLANT
CLOSURE WOUND 1/2 X4 (GAUZE/BANDAGES/DRESSINGS)
COUNTER SPONGE BAG EZ (MISCELLANEOUS)
CUTTER LINEAR ENDO 35 ART THIN (STAPLE) IMPLANT
DRSG TEGADERM 2-3/8X2-3/4 SM (GAUZE/BANDAGES/DRESSINGS) IMPLANT
DRSG TELFA 3X8 NADH (GAUZE/BANDAGES/DRESSINGS) IMPLANT
ENDOLOOP SUT PDS II  0 18 (SUTURE)
ENDOLOOP SUT PDS II 0 18 (SUTURE) IMPLANT
ENDOPOUCH RETRIEVER 10 (MISCELLANEOUS) IMPLANT
GLOVE BIO SURGEON STRL SZ7.5 (GLOVE) IMPLANT
GOWN STRL REUS W/ TWL LRG LVL3 (GOWN DISPOSABLE) IMPLANT
GOWN STRL REUS W/TWL LRG LVL3 (GOWN DISPOSABLE)
IRRIGATION STRYKERFLOW (MISCELLANEOUS) IMPLANT
IRRIGATOR STRYKERFLOW (MISCELLANEOUS)
IV NS 1000ML (IV SOLUTION)
IV NS 1000ML BAXH (IV SOLUTION) IMPLANT
KIT RM TURNOVER STRD PROC AR (KITS) ×3 IMPLANT
LABEL OR SOLS (LABEL) IMPLANT
NDL SAFETY 25GX1.5 (NEEDLE) IMPLANT
NS IRRIG 500ML POUR BTL (IV SOLUTION) IMPLANT
PACK LAP CHOLECYSTECTOMY (MISCELLANEOUS) IMPLANT
PAD GROUND ADULT SPLIT (MISCELLANEOUS) IMPLANT
RELOAD CUTTER ETS 35MM STAND (ENDOMECHANICALS) IMPLANT
SCISSORS METZENBAUM CVD 33 (INSTRUMENTS) IMPLANT
SEAL FOR SCOPE WARMER C3101 (MISCELLANEOUS) IMPLANT
SLEEVE ENDOPATH XCEL 5M (ENDOMECHANICALS) IMPLANT
STRIP CLOSURE SKIN 1/2X4 (GAUZE/BANDAGES/DRESSINGS) IMPLANT
SUT MNCRL 4-0 (SUTURE)
SUT MNCRL 4-0 27XMFL (SUTURE)
SUT VICRYL 0 AB UR-6 (SUTURE) IMPLANT
SUTURE MNCRL 4-0 27XMF (SUTURE) IMPLANT
SWABSTK COMLB BENZOIN TINCTURE (MISCELLANEOUS) IMPLANT
TROCAR XCEL BLUNT TIP 100MML (ENDOMECHANICALS) IMPLANT
TROCAR XCEL NON-BLD 5MMX100MML (ENDOMECHANICALS) IMPLANT
TUBING INSUFFLATOR HI FLOW (MISCELLANEOUS) IMPLANT
WATER STERILE IRR 1000ML POUR (IV SOLUTION) IMPLANT

## 2015-01-12 SURGICAL SUPPLY — 37 items
CANISTER SUCT 1200ML W/VALVE (MISCELLANEOUS) ×3 IMPLANT
CLOSURE WOUND 1/2 X4 (GAUZE/BANDAGES/DRESSINGS)
CUTTER LINEAR ENDO 35 ART THIN (STAPLE) ×3 IMPLANT
DRAIN PENROSE 5/8X12 LTX STRL (DRAIN) ×3 IMPLANT
DRAPE LAPAROTOMY 100X77 ABD (DRAPES) ×3 IMPLANT
DRSG OPSITE POSTOP 4X8 (GAUZE/BANDAGES/DRESSINGS) ×3 IMPLANT
ELECT CAUTERY NEEDLE TIP 1.0 (MISCELLANEOUS)
ELECTRODE CAUTERY NEDL TIP 1.0 (MISCELLANEOUS) IMPLANT
GAUZE SPONGE 4X4 12PLY STRL (GAUZE/BANDAGES/DRESSINGS) ×3 IMPLANT
GLOVE BIO SURGEON STRL SZ7.5 (GLOVE) ×6 IMPLANT
GLOVE INDICATOR 8.0 STRL GRN (GLOVE) ×3 IMPLANT
GOWN STRL REUS W/ TWL LRG LVL3 (GOWN DISPOSABLE) ×2 IMPLANT
GOWN STRL REUS W/TWL LRG LVL3 (GOWN DISPOSABLE) ×4
KIT RM TURNOVER STRD PROC AR (KITS) ×3 IMPLANT
LABEL OR SOLS (LABEL) IMPLANT
NS IRRIG 1000ML POUR BTL (IV SOLUTION) ×15 IMPLANT
NS IRRIG 500ML POUR BTL (IV SOLUTION) ×3 IMPLANT
PACK BASIN MINOR ARMC (MISCELLANEOUS) ×3 IMPLANT
PAD GROUND ADULT SPLIT (MISCELLANEOUS) ×3 IMPLANT
RELOAD STAPLE SKIN SM 35W (MISCELLANEOUS) ×3 IMPLANT
SPONGE LAP 18X18 5 PK (GAUZE/BANDAGES/DRESSINGS) ×6 IMPLANT
STRIP CLOSURE SKIN 1/2X4 (GAUZE/BANDAGES/DRESSINGS) IMPLANT
SUT CHROMIC BR 1/2CLE 2-0 54IN (SUTURE) ×3 IMPLANT
SUT ETH BLK MONO 3 0 FS 1 12/B (SUTURE) IMPLANT
SUT ETHILON 3-0 KS 30 BLK (SUTURE) ×3 IMPLANT
SUT ETHILON 5 0 PS 2 18 (SUTURE) ×3 IMPLANT
SUT PDS AB 1 TP1 54 (SUTURE) ×3 IMPLANT
SUT SILK 3-0 (SUTURE) ×2
SUT SILK 3-0 SH-1 18XCR BRD (SUTURE) ×1
SUT VIC AB 0 SH 27 (SUTURE) ×3 IMPLANT
SUT VIC AB 3-0 SH 27 (SUTURE) ×2
SUT VIC AB 3-0 SH 27X BRD (SUTURE) ×1 IMPLANT
SUT VIC AB 4-0 SH 27 (SUTURE)
SUT VIC AB 4-0 SH 27XANBCTRL (SUTURE) IMPLANT
SUTURE SILK 3-0 SH-1 18XCR BRD (SUTURE) ×1 IMPLANT
SWAB CULTURE AMIES ANAERIB BLU (MISCELLANEOUS) IMPLANT
SYR BULB IRRIG 60ML STRL (SYRINGE) ×3 IMPLANT
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
## 2015-01-12 NOTE — Progress Notes (Signed)
PNL from [facility] reviewed O pos / ABSC neg / RI / VZI / HBsAg neg / RPR NR / GC & CT neg & neg / elevated TSH of 4.45 on 10/03/14 with repeat on 12/18/14 normal at 3.16 and 0.87 for FT4.  HgbA1C in the 6 range

## 2015-01-12 NOTE — Progress Notes (Signed)
Subjective:  Continues to report abdominal pain, unchanged in quality or intensity.    Objective:   Vitals: Blood pressure 146/84, pulse 115, temperature 99.8 F (37.7 C), temperature source Oral, resp. rate 18, height 5\' 4"  (1.626 m), weight 82.101 kg (181 lb). General: Comfortable when not moving Abdomen: gravid, diffusely tender Cervical Exam:  Dilation: Fingertip Effacement (%): Thick Cervical Position: Posterior Station: -3 Exam by:: AMS  FHT: 130, moderate, positive accels, occasional deceleration noted Toco: none  Results for orders placed or performed during the hospital encounter of 01/12/15 (from the past 24 hour(s))  CBC     Status: Abnormal   Collection Time: 01/12/15  1:33 PM  Result Value Ref Range   WBC 17.7 (H) 3.6 - 11.0 K/uL   RBC 4.56 3.80 - 5.20 MIL/uL   Hemoglobin 14.1 12.0 - 16.0 g/dL   HCT 42.3 35.0 - 47.0 %   MCV 92.8 80.0 - 100.0 fL   MCH 31.0 26.0 - 34.0 pg   MCHC 33.4 32.0 - 36.0 g/dL   RDW 12.5 11.5 - 14.5 %   Platelets 398 150 - 440 K/uL  Comprehensive metabolic panel     Status: Abnormal   Collection Time: 01/12/15  1:33 PM  Result Value Ref Range   Sodium 134 (L) 135 - 145 mmol/L   Potassium 4.0 3.5 - 5.1 mmol/L   Chloride 104 101 - 111 mmol/L   CO2 19 (L) 22 - 32 mmol/L   Glucose, Bld 207 (H) 65 - 99 mg/dL   BUN 8 6 - 20 mg/dL   Creatinine, Ser 0.70 0.44 - 1.00 mg/dL   Calcium 9.2 8.9 - 10.3 mg/dL   Total Protein 7.3 6.5 - 8.1 g/dL   Albumin 3.1 (L) 3.5 - 5.0 g/dL   AST 25 15 - 41 U/L   ALT 15 14 - 54 U/L   Alkaline Phosphatase 102 38 - 126 U/L   Total Bilirubin 0.2 (L) 0.3 - 1.2 mg/dL   GFR calc non Af Amer >60 >60 mL/min   GFR calc Af Amer >60 >60 mL/min   Anion gap 11 5 - 15  Lipase, blood     Status: Abnormal   Collection Time: 01/12/15  1:33 PM  Result Value Ref Range   Lipase 16 (L) 22 - 51 U/L  Kleihauer-Betke stain     Status: None   Collection Time: 01/12/15  1:34 PM  Result Value Ref Range   Fetal Cells % 0 %   Quantitation Fetal Hemoglobin 0.0000 mL   # Vials RhIg NOT INDICATED   ABO/Rh     Status: None   Collection Time: 01/12/15  1:35 PM  Result Value Ref Range   ABO/RH(D) O POS   Urine Drug Screen, Qualitative ([facility] only)     Status: Abnormal   Collection Time: 01/12/15  2:15 PM  Result Value Ref Range   Tricyclic, Ur Screen NONE DETECTED NONE DETECTED   Amphetamines, Ur Screen NONE DETECTED NONE DETECTED   MDMA (Ecstasy)Ur Screen NONE DETECTED NONE DETECTED   Cocaine Metabolite,Ur [place] POSITIVE (A) NONE DETECTED   Opiate, Ur Screen NONE DETECTED NONE DETECTED   Phencyclidine (PCP) Ur S NONE DETECTED NONE DETECTED   Cannabinoid 50 Ng, Ur [place] NONE DETECTED NONE DETECTED   Barbiturates, Ur Screen NONE DETECTED NONE DETECTED   Benzodiazepine, Ur Scrn NONE DETECTED NONE DETECTED   Methadone Scn, Ur NONE DETECTED NONE DETECTED  Urinalysis complete, with microscopic ([facility] only)     Status: Abnormal  Collection Time: 01/12/15  2:15 PM  Result Value Ref Range   Color, Urine AMBER (A) YELLOW   APPearance HAZY (A) CLEAR   Glucose, UA 50 (A) NEGATIVE mg/dL   Bilirubin Urine NEGATIVE NEGATIVE   Ketones, ur TRACE (A) NEGATIVE mg/dL   Specific Gravity, Urine 1.024 1.005 - 1.030   Hgb urine dipstick NEGATIVE NEGATIVE   pH 5.0 5.0 - 8.0   Protein, ur 100 (A) NEGATIVE mg/dL   Nitrite NEGATIVE NEGATIVE   Leukocytes, UA 3+ (A) NEGATIVE   RBC / HPF TOO NUMEROUS TO COUNT 0 - 5 RBC/hpf   WBC, UA TOO NUMEROUS TO COUNT 0 - 5 WBC/hpf   Bacteria, UA NONE SEEN NONE SEEN   Squamous Epithelial / LPF 0-5 (A) NONE SEEN   Mucous PRESENT   Protein / creatinine ratio, urine     Status: Abnormal   Collection Time: 01/12/15  2:15 PM  Result Value Ref Range   Creatinine, Urine 326 mg/dL   Total Protein, Urine 149 mg/dL   Protein Creatinine Ratio 0.46 (H) 0.00 - 0.15 mg/mg[Cre]  Glucose, capillary     Status: Abnormal   Collection Time: 01/12/15  5:55 PM  Result Value Ref Range   Glucose-Capillary 191 (H) 65 -  99 mg/dL  Glucose, capillary     Status: Abnormal   Collection Time: 01/12/15  9:47 PM  Result Value Ref Range   Glucose-Capillary 233 (H) 65 - 99 mg/dL    Assessment:   39 y.o. [id number] [redacted]w[redacted]d with acute appendicitis by CT scan  Plan:  1) Appendicitis - has been NPO since arriving other than nifedipine  - Dr. Rexene Edison informed about patient currently in OR - Discussed case with anesthesis - Paged [facility] to see if potential exists for transfer given high surgical patient - start cefoxitin 2g IV - Repeat CBC  I discussed the need for surgical intervention with the patient, the fact that she is a high risk surgical candidate and that while there are surgical risk for her and the fetus the risks of not proceeding with surgery are likely greater.    2) DM II  - continue SSI - recheck BMP verify no development of AG  3) Fetus - category II tracing

## 2015-01-12 NOTE — Progress Notes (Signed)
Discussed with patient whether she has any history of cocaine use.  Patient denies it.  States that she has been taking a herbal medicine called Kratom to help her stop her opoid dependency.  She states she purchases it online and also gets it from an herbal store in [place].  States that the father of the baby is a cocaine addict and that she recently kicked him out because of it since she is trying to get off of doing any drugs due to being pregnant.  States that she did use the rest of his Kratom that he had left lying around and is concerned that perhaps he had mixed cocaine in with his. States she may have gotten cocaine from the leftover Kratom.  MD notified of this.  Patient concerned about having cocaine in her system because she is worried about it harming the baby.  Discussed with patient need to avoid taking Kratom during pregnancy and to discuss safer options with her OB providers for decreasing her opoid dependency to OxyContin.

## 2015-01-12 NOTE — Anesthesia Preprocedure Evaluation (Addendum)
Anesthesia Evaluation  Patient identified by MRN, date of birth, ID band Patient awake    Reviewed: Allergy & Precautions, NPO status , Patient's Chart, lab work & pertinent test results  Airway Mallampati: II       Dental no notable dental hx.    Pulmonary Current Smoker,  + rhonchi   + decreased breath sounds      Cardiovascular hypertension, Pt. on medications + Past MI Rate:Tachycardia     Neuro/Psych    GI/Hepatic negative GI ROS, Patient received Oral Contrast Agents,(+)     substance abuse  cocaine use,   Endo/Other  diabetes, Type 1, Insulin Dependent  Renal/GU negative Renal ROS     Musculoskeletal negative musculoskeletal ROS (+)   Abdominal   Peds  Hematology negative hematology ROS (+)   Anesthesia Other Findings   Reproductive/Obstetrics                            Anesthesia Physical Anesthesia Plan  ASA: III and emergent  Anesthesia Plan: General   Post-op Pain Management:    Induction: Intravenous  Airway Management Planned: Oral ETT  Additional Equipment:   Intra-op Plan:   Post-operative Plan: Extubation in OR  Informed Consent: I have reviewed the patients History and Physical, chart, labs and discussed the procedure including the risks, benefits and alternatives for the proposed anesthesia with the patient or authorized representative who has indicated his/her understanding and acceptance.     Plan Discussed with: CRNA and Surgeon  Anesthesia Plan Comments:         Anesthesia Quick Evaluation

## 2015-01-12 NOTE — Progress Notes (Signed)
Discussed case with [facility] radiology states spinal fusion hardware is not a contraindication to MRI and would still be preferred study.  Will proceed with MRI w/o contrast abdomen

## 2015-01-12 NOTE — Anesthesia Preprocedure Evaluation (Signed)
Anesthesia Evaluation   Patient awake    Airway        Dental   Pulmonary COPDCurrent Smoker,          Cardiovascular hypertension, Pt. on medications + Past MI and + Cardiac Stents     Neuro/Psych Anxiety Depression    GI/Hepatic (+)     substance abuse  cocaine use,   Endo/Other  diabetes, Type 1, Insulin Dependent  Renal/GU      Musculoskeletal   Abdominal   Peds  Hematology   Anesthesia Other Findings   Reproductive/Obstetrics (+) Pregnancy                             Anesthesia Physical Anesthesia Plan  ASA: IV and emergent  Anesthesia Plan: General   Post-op Pain Management:    Induction: Intravenous  Airway Management Planned: Oral ETT  Additional Equipment:   Intra-op Plan:   Post-operative Plan: Extubation in OR  Informed Consent: I have reviewed the patients History and Physical, chart, labs and discussed the procedure including the risks, benefits and alternatives for the proposed anesthesia with the patient or authorized representative who has indicated his/her understanding and acceptance.     Plan Discussed with: CRNA and Surgeon  Anesthesia Plan Comments:         Anesthesia Quick Evaluation

## 2015-01-12 NOTE — Progress Notes (Signed)
MRI staff called to floor stating that patient is refusing to have CT scan. Reported that patient stating she will not do it and she just wants pain medicine.  Patient was given 1 mg of Ativan prior to going down for scan.  Discussed with MRI staff need to notify MD to see if alternate order requested since patient refusing current plan of action.

## 2015-01-12 NOTE — Progress Notes (Signed)
Subjective:  Pain remains unchanged in intensity and quality  Objective:   Vitals: Blood pressure 154/96, pulse 140, temperature 99.8 F (37.7 C), temperature source Oral, resp. rate 18, height 5\' 4"  (1.626 m), weight 82.101 kg (181 lb). General: Continue to appear uncomfortable, better sitting than laying down Abdomen: Unchanged continues to report difusse tenderness Cervical Exam:  Dilation: Fingertip Effacement (%): Thick Cervical Position: Posterior Station: -3 Exam by:: AMS  FHT: 150, moderate, positive accels (10x10), no decels Toco: absent  Results for orders placed or performed during the hospital encounter of 01/12/15 (from the past 24 hour(s))  CBC     Status: Abnormal   Collection Time: 01/12/15  1:33 PM  Result Value Ref Range   WBC 17.7 (H) 3.6 - 11.0 K/uL   RBC 4.56 3.80 - 5.20 MIL/uL   Hemoglobin 14.1 12.0 - 16.0 g/dL   HCT 42.3 35.0 - 47.0 %   MCV 92.8 80.0 - 100.0 fL   MCH 31.0 26.0 - 34.0 pg   MCHC 33.4 32.0 - 36.0 g/dL   RDW 12.5 11.5 - 14.5 %   Platelets 398 150 - 440 K/uL  Comprehensive metabolic panel     Status: Abnormal   Collection Time: 01/12/15  1:33 PM  Result Value Ref Range   Sodium 134 (L) 135 - 145 mmol/L   Potassium 4.0 3.5 - 5.1 mmol/L   Chloride 104 101 - 111 mmol/L   CO2 19 (L) 22 - 32 mmol/L   Glucose, Bld 207 (H) 65 - 99 mg/dL   BUN 8 6 - 20 mg/dL   Creatinine, Ser 0.70 0.44 - 1.00 mg/dL   Calcium 9.2 8.9 - 10.3 mg/dL   Total Protein 7.3 6.5 - 8.1 g/dL   Albumin 3.1 (L) 3.5 - 5.0 g/dL   AST 25 15 - 41 U/L   ALT 15 14 - 54 U/L   Alkaline Phosphatase 102 38 - 126 U/L   Total Bilirubin 0.2 (L) 0.3 - 1.2 mg/dL   GFR calc non Af Amer >60 >60 mL/min   GFR calc Af Amer >60 >60 mL/min   Anion gap 11 5 - 15  Lipase, blood     Status: Abnormal   Collection Time: 01/12/15  1:33 PM  Result Value Ref Range   Lipase 16 (L) 22 - 51 U/L  Urine Drug Screen, Qualitative ([facility] only)     Status: Abnormal   Collection Time: 01/12/15  2:15 PM   Result Value Ref Range   Tricyclic, Ur Screen NONE DETECTED NONE DETECTED   Amphetamines, Ur Screen NONE DETECTED NONE DETECTED   MDMA (Ecstasy)Ur Screen NONE DETECTED NONE DETECTED   Cocaine Metabolite,Ur [place] POSITIVE (A) NONE DETECTED   Opiate, Ur Screen NONE DETECTED NONE DETECTED   Phencyclidine (PCP) Ur S NONE DETECTED NONE DETECTED   Cannabinoid 50 Ng, Ur [place] NONE DETECTED NONE DETECTED   Barbiturates, Ur Screen NONE DETECTED NONE DETECTED   Benzodiazepine, Ur Scrn NONE DETECTED NONE DETECTED   Methadone Scn, Ur NONE DETECTED NONE DETECTED  Urinalysis complete, with microscopic ([facility] only)     Status: Abnormal   Collection Time: 01/12/15  2:15 PM  Result Value Ref Range   Color, Urine AMBER (A) YELLOW   APPearance HAZY (A) CLEAR   Glucose, UA 50 (A) NEGATIVE mg/dL   Bilirubin Urine NEGATIVE NEGATIVE   Ketones, ur TRACE (A) NEGATIVE mg/dL   Specific Gravity, Urine 1.024 1.005 - 1.030   Hgb urine dipstick NEGATIVE NEGATIVE   pH 5.0  5.0 - 8.0   Protein, ur 100 (A) NEGATIVE mg/dL   Nitrite NEGATIVE NEGATIVE   Leukocytes, UA 3+ (A) NEGATIVE   RBC / HPF TOO NUMEROUS TO COUNT 0 - 5 RBC/hpf   WBC, UA TOO NUMEROUS TO COUNT 0 - 5 WBC/hpf   Bacteria, UA NONE SEEN NONE SEEN   Squamous Epithelial / LPF 0-5 (A) NONE SEEN   Mucous PRESENT     Assessment:   39 y.o. [id number] [redacted]w[redacted]d with abdominal pain, leukocytosis  Plan:  1) Abdominal pain - WBC of 17.7K, given degree of abdominal pain will proceed with CT scan.  Not MRI candidate given history of two prior spinal fusions - IV morphine 4mg  IV once  2) Fetus - cat I tracing  3) Urine - blood and leukocytes but no bacteria (I&O cath) does report nephrolithiasis 3 months ago  4) BG 207 - SSI written, no evidence of AG or ketones on UA  5) Vaginal bleeding - no evidence of bleeding currently, reassuring fetal monitoring.  KB pending to rule out abruption
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
## 2015-01-12 NOTE — Consult Note (Signed)
Surgery Consult Note  CC: RLQ pain  HPI: Nichole Wallace is a pleasant 39 yo F who is [redacted] weeks pregnant and presents with a history of MI in 2011 and with cocaine + urinalysis who presents with 3 days of RLQ pain.  Worse today.  Has had on and off RLQ pain for past months.  Poor appetite.  No fevers/chills, night sweats, shortness of breath, cough, chest pain, diarrhea/constipation, dysuria/hematuria.  Active Ambulatory Problems    Diagnosis Date Noted  . No Active Ambulatory Problems   Resolved Ambulatory Problems    Diagnosis Date Noted  . No Resolved Ambulatory Problems   Past Medical History  Diagnosis Date  . Hypertension   . Preterm labor   . Diabetes mellitus without complication   . Depression   . Anxiety   . Myocardial infarction    Past Surgical History  Procedure Laterality Date  . Tonsillectomy    . Coronary angioplasty with stent placement    . Gallbladder surgery    . Neck fusion       Medication List    ASK your doctor about these medications        aspirin 81 MG chewable tablet  Chew by mouth daily.     oxymorphone 10 MG 12 hr tablet  Commonly known as:  OPANA ER  Take 10 mg by mouth every 12 (twelve) hours.     prenatal multivitamin Tabs tablet  Take 1 tablet by mouth daily at 12 noon.       Social History   Social History  . Marital Status: Single    Spouse Name: N/A  . Number of Children: N/A  . Years of Education: N/A   Occupational History  . Not on file.   Social History Main Topics  . Smoking status: Current Every Day Smoker -- 0.50 packs/day for 15 years  . Smokeless tobacco: Not on file  . Alcohol Use: No     Comment: oxycontin daily for neck pain  . Drug Use: Yes    Special: Other-see comments  . Sexual Activity: Yes   Other Topics Concern  . Not on file   Social History Narrative  . No narrative on file   Family History  Problem Relation Age of Onset  . Cancer Father   . Hepatitis B Father   . Hepatitis C Father   .  Diabetes Mother   . Hepatitis B Mother   . Hepatitis C Mother   . Osteoarthritis Mother    ROS: Full ROS obtained, pertinent positives and negatives as above  Blood pressure 146/84, pulse 115, temperature 99.8 F (37.7 C), temperature source Oral, resp. rate 18, height 5\' 4"  (1.626 m), weight 181 lb (82.101 kg). GEN: NAD/A&Ox3 FACE: no obvious facial trauma, normal external nose, normal external ears EYES: no scleral icterus, no conjunctivitis HEAD: normocephalic atraumatic CV: RRR, no MRG RESP: moving air well, lungs clear ABD: soft, tender right of umbilicus, gravid EXT: moving all ext well, strength 5/5 NEURO: cnII-XII grossly intact, sensation intact all 4 ext  Labs: Personally reviewed, significant for WBC 17.7, Glucose 207  CT: Personally reviewed, significant for RLQ, pericecal inflammation  A/P 39 yo pregnant female with probable appendicitis. Have recommended appendectomy.  Have discussed the risks and benefits of having surgery and not having surgery, including premature labor and fetal loss.  Have also discussed the risk of complications due to the fact that she needs emergent surgery and that she has had a history of MI  as well as is cocaine positive.  Have discussed with Dr. Georgianne Fick who will be available during surgery.  Patient agrees with plan.

## 2015-01-13 DIAGNOSIS — Z331 Pregnant state, incidental: Secondary | ICD-10-CM | POA: Diagnosis not present

## 2015-01-13 DIAGNOSIS — O26893 Other specified pregnancy related conditions, third trimester: Secondary | ICD-10-CM | POA: Diagnosis not present

## 2015-01-13 DIAGNOSIS — G8918 Other acute postprocedural pain: Secondary | ICD-10-CM | POA: Diagnosis not present

## 2015-01-13 DIAGNOSIS — O99323 Drug use complicating pregnancy, third trimester: Secondary | ICD-10-CM | POA: Diagnosis not present

## 2015-01-13 DIAGNOSIS — K353 Acute appendicitis with localized peritonitis: Secondary | ICD-10-CM | POA: Diagnosis not present

## 2015-01-13 DIAGNOSIS — O10013 Pre-existing essential hypertension complicating pregnancy, third trimester: Secondary | ICD-10-CM | POA: Diagnosis not present

## 2015-01-13 DIAGNOSIS — K352 Acute appendicitis with generalized peritonitis: Secondary | ICD-10-CM | POA: Diagnosis not present

## 2015-01-13 DIAGNOSIS — R000 Tachycardia, unspecified: Secondary | ICD-10-CM | POA: Diagnosis not present

## 2015-01-13 DIAGNOSIS — Z9089 Acquired absence of other organs: Secondary | ICD-10-CM | POA: Diagnosis not present

## 2015-01-13 DIAGNOSIS — I213 ST elevation (STEMI) myocardial infarction of unspecified site: Secondary | ICD-10-CM | POA: Diagnosis not present

## 2015-01-13 DIAGNOSIS — Z3A38 38 weeks gestation of pregnancy: Secondary | ICD-10-CM | POA: Diagnosis not present

## 2015-01-13 DIAGNOSIS — K3532 Acute appendicitis with perforation and localized peritonitis, without abscess: Secondary | ICD-10-CM | POA: Insufficient documentation

## 2015-01-13 DIAGNOSIS — Z7401 Bed confinement status: Secondary | ICD-10-CM | POA: Diagnosis not present

## 2015-01-13 DIAGNOSIS — Z4889 Encounter for other specified surgical aftercare: Secondary | ICD-10-CM | POA: Diagnosis not present

## 2015-01-13 DIAGNOSIS — I517 Cardiomegaly: Secondary | ICD-10-CM | POA: Diagnosis not present

## 2015-01-13 LAB — BASIC METABOLIC PANEL
Anion gap: 8 (ref 5–15)
BUN: 8 mg/dL (ref 6–20)
CHLORIDE: 103 mmol/L (ref 101–111)
CO2: 20 mmol/L — ABNORMAL LOW (ref 22–32)
CREATININE: 0.72 mg/dL (ref 0.44–1.00)
Calcium: 9.5 mg/dL (ref 8.9–10.3)
Glucose, Bld: 251 mg/dL — ABNORMAL HIGH (ref 65–99)
Potassium: 4.6 mmol/L (ref 3.5–5.1)
SODIUM: 131 mmol/L — AB (ref 135–145)

## 2015-01-13 LAB — CBC
HCT: 35.2 % (ref 35.0–47.0)
Hemoglobin: 11.7 g/dL — ABNORMAL LOW (ref 12.0–16.0)
MCH: 31.1 pg (ref 26.0–34.0)
MCHC: 33.2 g/dL (ref 32.0–36.0)
MCV: 93.8 fL (ref 80.0–100.0)
PLATELETS: 316 10*3/uL (ref 150–440)
RBC: 3.75 MIL/uL — AB (ref 3.80–5.20)
RDW: 12.6 % (ref 11.5–14.5)
WBC: 20.8 10*3/uL — AB (ref 3.6–11.0)

## 2015-01-13 LAB — GLUCOSE, CAPILLARY
GLUCOSE-CAPILLARY: 230 mg/dL — AB (ref 65–99)
GLUCOSE-CAPILLARY: 223 mg/dL — AB (ref 65–99)
GLUCOSE-CAPILLARY: 157 mg/dL — AB (ref 65–99)
Glucose-Capillary: 214 mg/dL — ABNORMAL HIGH (ref 65–99)

## 2015-01-13 MED ORDER — ONDANSETRON HCL 4 MG/2ML IJ SOLN: 4.0000 mg | Freq: Once | INTRAMUSCULAR | Status: DC | PRN

## 2015-01-13 MED ORDER — NALOXONE HCL 0.4 MG/ML IJ SOLN: 0.4000 mg | INTRAMUSCULAR | Status: DC | PRN

## 2015-01-13 MED ORDER — INSULIN REGULAR HUMAN 100 UNIT/ML IJ SOLN
INTRAMUSCULAR | Status: DC
  Filled 2015-01-13: qty 2.5

## 2015-01-13 MED ORDER — DIPHENHYDRAMINE HCL 50 MG/ML IJ SOLN: 12.5000 mg | Freq: Four times a day (QID) | INTRAMUSCULAR | Status: DC | PRN

## 2015-01-13 MED ORDER — LIDOCAINE HCL (CARDIAC) 20 MG/ML IV SOLN
INTRAVENOUS | Status: DC | PRN
  Administered 2015-01-12: 100 mg via INTRAVENOUS

## 2015-01-13 MED ORDER — GLYCOPYRROLATE 0.2 MG/ML IJ SOLN
INTRAMUSCULAR | Status: DC | PRN
  Administered 2015-01-13: .8 mg via INTRAVENOUS

## 2015-01-13 MED ORDER — MIDAZOLAM HCL 2 MG/2ML IJ SOLN
INTRAMUSCULAR | Status: DC | PRN
  Administered 2015-01-12: 2 mg via INTRAVENOUS

## 2015-01-13 MED ORDER — INSULIN ASPART 100 UNIT/ML ~~LOC~~ SOLN
6.0000 [IU] | Freq: Once | SUBCUTANEOUS | Status: AC
  Administered 2015-01-13: 6 [IU] via SUBCUTANEOUS

## 2015-01-13 MED ORDER — INSULIN ASPART 100 UNIT/ML ~~LOC~~ SOLN
2.0000 [IU] | SUBCUTANEOUS | Status: DC
  Administered 2015-01-13: 6 [IU] via SUBCUTANEOUS
  Administered 2015-01-13: 4 [IU] via SUBCUTANEOUS
  Filled 2015-01-13: qty 6
  Filled 2015-01-13: qty 4
  Filled 2015-01-13: qty 6

## 2015-01-13 MED ORDER — FENTANYL CITRATE (PF) 100 MCG/2ML IJ SOLN
INTRAMUSCULAR | Status: DC | PRN
  Administered 2015-01-12 – 2015-01-13 (×4): 100 ug via INTRAVENOUS
  Administered 2015-01-13: 25 ug
  Administered 2015-01-13: 100 ug via INTRAVENOUS
  Administered 2015-01-13: 50 ug via INTRAVENOUS

## 2015-01-13 MED ORDER — DEXAMETHASONE SODIUM PHOSPHATE 4 MG/ML IJ SOLN
INTRAMUSCULAR | Status: DC | PRN
  Administered 2015-01-12: 10 mg via INTRAVENOUS

## 2015-01-13 MED ORDER — DIPHENHYDRAMINE HCL 12.5 MG/5ML PO ELIX: 12.5000 mg | ORAL_SOLUTION | Freq: Four times a day (QID) | ORAL | Status: DC | PRN

## 2015-01-13 MED ORDER — KETOROLAC TROMETHAMINE 30 MG/ML IJ SOLN
INTRAMUSCULAR | Status: DC | PRN
Start: 2015-01-13 — End: 2015-01-14
  Administered 2015-01-13: 30 mg via INTRAVENOUS

## 2015-01-13 MED ORDER — SUCCINYLCHOLINE CHLORIDE 20 MG/ML IJ SOLN
INTRAMUSCULAR | Status: DC | PRN
  Administered 2015-01-12: 100 mg via INTRAVENOUS

## 2015-01-13 MED ORDER — ESMOLOL HCL 10 MG/ML IV SOLN
INTRAVENOUS | Status: DC | PRN
  Administered 2015-01-13: 20 mg via INTRAVENOUS
  Administered 2015-01-13: 50 mg via INTRAVENOUS

## 2015-01-13 MED ORDER — MORPHINE SULFATE 1 MG/ML IV SOLN
INTRAVENOUS | Status: AC
  Filled 2015-01-13: qty 25

## 2015-01-13 MED ORDER — INSULIN GLARGINE 100 UNIT/ML ~~LOC~~ SOLN
15.0000 [IU] | Freq: Every day | SUBCUTANEOUS | Status: DC
  Administered 2015-01-13: 15 [IU] via SUBCUTANEOUS
  Filled 2015-01-13: qty 0.15

## 2015-01-13 MED ORDER — ONDANSETRON HCL 4 MG/2ML IJ SOLN
INTRAMUSCULAR | Status: DC | PRN
  Administered 2015-01-12: 4 mg via INTRAVENOUS

## 2015-01-13 MED ORDER — PROPOFOL 10 MG/ML IV BOLUS
INTRAVENOUS | Status: DC | PRN
  Administered 2015-01-12: 200 mg via INTRAVENOUS

## 2015-01-13 MED ORDER — ONDANSETRON HCL 4 MG/2ML IJ SOLN: 4.0000 mg | Freq: Four times a day (QID) | INTRAMUSCULAR | Status: DC | PRN

## 2015-01-13 MED ORDER — MORPHINE SULFATE 1 MG/ML IV SOLN
INTRAVENOUS | Status: DC
  Administered 2015-01-13: 7.5 mg via INTRAVENOUS
  Administered 2015-01-13: 4.5 mg via INTRAVENOUS
  Administered 2015-01-13: 04:00:00 via INTRAVENOUS
  Administered 2015-01-13: 6 mg via INTRAVENOUS

## 2015-01-13 MED ORDER — LACTATED RINGERS IV SOLN
INTRAVENOUS | Status: DC | PRN
  Administered 2015-01-12: via INTRAVENOUS

## 2015-01-13 MED ORDER — FENTANYL CITRATE (PF) 100 MCG/2ML IJ SOLN
25.0000 ug | INTRAMUSCULAR | Status: DC | PRN
  Administered 2015-01-13 (×5): 25 ug via INTRAVENOUS

## 2015-01-13 MED ORDER — NEOSTIGMINE METHYLSULFATE 10 MG/10ML IV SOLN
INTRAVENOUS | Status: DC | PRN
  Administered 2015-01-13: 4 mg via INTRAVENOUS

## 2015-01-13 MED ORDER — ROCURONIUM BROMIDE 100 MG/10ML IV SOLN
INTRAVENOUS | Status: DC | PRN
  Administered 2015-01-12: 30 mg via INTRAVENOUS
  Administered 2015-01-13 (×2): 10 mg via INTRAVENOUS

## 2015-01-13 MED ORDER — SODIUM CHLORIDE 0.9 % IJ SOLN: 9.0000 mL | INTRAMUSCULAR | Status: DC | PRN

## 2015-01-13 NOTE — Op Note (Signed)
Preoperative dx: Appendicitis Postoperative dx: Appendicitis, ruptured Procedure: Open appendectomy  Surgeon: Tirza Senteno Assistant: Georgianne Fick Anesthesia: GETA EBL: 50 ml Specimen: ? Appendix.  Indication for procedure: Nichole Wallace presented with 3 days of RLQ pain, leukocytosis and clinical and radiographic signs of appendicitis.  We brought her to the OR for surgical management of appendicitis.  Details of procedure:  Informed consent was obtained.  Nichole Wallace was brought to the or suite and laid on the OR table.  She was induced, intubated and general anesthesia administered.  Her abdomen was prepped and draped.  Time out performed.  In the direction of a McBurney but lateral to the umbilicus where the area of inflammation was seen on imaging.  Deepened down to fascia and fascia incised.  The external obliques were spread apart.  The peritoneum was entered.  Immediately there was a large amount of foul smelling purulence.  I then proceeded to identify the right tube and ovary.  The ovary was significantly inflamed and attached to the cecum.  The cecum was then mobilized and the terminal ileum was mobilized to allow visualization of where it entered the cecum.  I then followed the tenia to where the appendix should be.  There was a small stub which I felt was the remnant of the appendix on the cecum.  This was ligated flush with the cecum with an endostapler.  I then examined the cecum thoroughly to evaluate for any other structure that could be the appendix.  It had a hard rind to the distal aspect which was carefully examined and dissected.  I then sent many hard appendiceal candidates that were attached to what appeared to be the mesoappendix to pathology.  I then examined the retroperitoneum and saw no other candidates.  I then checked for hemostasis.  Once I was happy with hemostasis, I then irrigated the abdomen with 6L of warm NS.  I then placed a RLQ JP drain in the right paracolic gutter under  the cecum.  I then closed the peritoneum with a running 0 vicryl and the fascia with a running 1PDS.  The wound was then closed with staples interspersed with penrose drains.  Sterile dressings were then placed over the wounds.  The patient was then awoken and brought to the PACU.  There were no immediate complications.  Needle, sponge and instrument counts were correct at the end of the procedure.

## 2015-01-13 NOTE — Brief Op Note (Signed)
01/12/2015 - 01/13/2015  1:43 AM  PATIENT:  Nichole Wallace  39 y.o. female  PRE-OPERATIVE DIAGNOSIS:  acute appendicitis  POST-OPERATIVE DIAGNOSIS:  acute appendicitis, ruptured  PROCEDURE:  Procedure(s): APPENDECTOMY (N/A)  SURGEON:  Surgeon(s) and Role:    * Marlyce Huge, MD - Primary    * Malachy Mood, MD - Assisting  PHYSICIAN ASSISTANT:   ASSISTANTS: none   ANESTHESIA:   general  EBL:  Total I/O In: 1280 [I.V.:1230; IV Piggyback:50] Out: 350 [Urine:300; Blood:50]  BLOOD ADMINISTERED:none  DRAINS: Penrose drain in the subq , jp in the right colic gutter  LOCAL MEDICATIONS USED:  NONE  SPECIMEN:  Biopsy / Limited Resection and Excision ? appendix  DISPOSITION OF SPECIMEN:  PATHOLOGY  COUNTS:  YES  TOURNIQUET:  * No tourniquets in log *  DICTATION: .Note written in EPIC  PLAN OF CARE: Admit to inpatient   PATIENT DISPOSITION:  PACU - hemodynamically stable.   Delay start of Pharmacological VTE agent (>24hrs) due to surgical blood loss or risk of bleeding: not applicable

## 2015-01-13 NOTE — Progress Notes (Signed)
Subjective:  Discussed surgery findings with patient, current plan of care  Objective:   Filed Vitals:   01/13/15 0240 01/13/15 0245 01/13/15 0255 01/13/15 0356  BP:  134/84 148/92 152/72  Pulse: 91 83 95 96  Temp:   97.8 F (36.6 C)   TempSrc:   Oral   Resp: 12 12 16    Height:      Weight:      SpO2: 93% 93%      General: NAD Abdomen: gravid, soft, still tender Cervical Exam:  Dilation: Fingertip Effacement (%): Thick Cervical Position: Posterior Station: -3 Exam by:: AMS  FHT: 140, minimal variability, no accels, occasional unprovoked deceleration Toco: absent  Results for orders placed or performed during the hospital encounter of 01/12/15 (from the past 24 hour(s))  CBC     Status: Abnormal   Collection Time: 01/12/15  1:33 PM  Result Value Ref Range   WBC 17.7 (H) 3.6 - 11.0 K/uL   RBC 4.56 3.80 - 5.20 MIL/uL   Hemoglobin 14.1 12.0 - 16.0 g/dL   HCT 42.3 35.0 - 47.0 %   MCV 92.8 80.0 - 100.0 fL   MCH 31.0 26.0 - 34.0 pg   MCHC 33.4 32.0 - 36.0 g/dL   RDW 12.5 11.5 - 14.5 %   Platelets 398 150 - 440 K/uL  Comprehensive metabolic panel     Status: Abnormal   Collection Time: 01/12/15  1:33 PM  Result Value Ref Range   Sodium 134 (L) 135 - 145 mmol/L   Potassium 4.0 3.5 - 5.1 mmol/L   Chloride 104 101 - 111 mmol/L   CO2 19 (L) 22 - 32 mmol/L   Glucose, Bld 207 (H) 65 - 99 mg/dL   BUN 8 6 - 20 mg/dL   Creatinine, Ser 0.70 0.44 - 1.00 mg/dL   Calcium 9.2 8.9 - 10.3 mg/dL   Total Protein 7.3 6.5 - 8.1 g/dL   Albumin 3.1 (L) 3.5 - 5.0 g/dL   AST 25 15 - 41 U/L   ALT 15 14 - 54 U/L   Alkaline Phosphatase 102 38 - 126 U/L   Total Bilirubin 0.2 (L) 0.3 - 1.2 mg/dL   GFR calc non Af Amer >60 >60 mL/min   GFR calc Af Amer >60 >60 mL/min   Anion gap 11 5 - 15  Lipase, blood     Status: Abnormal   Collection Time: 01/12/15  1:33 PM  Result Value Ref Range   Lipase 16 (L) 22 - 51 U/L  Kleihauer-Betke stain     Status: None   Collection Time: 01/12/15  1:34 PM   Result Value Ref Range   Fetal Cells % 0 %   Quantitation Fetal Hemoglobin 0.0000 mL   # Vials RhIg NOT INDICATED   ABO/Rh     Status: None   Collection Time: 01/12/15  1:35 PM  Result Value Ref Range   ABO/RH(D) O POS   Urine Drug Screen, Qualitative ([facility] only)     Status: Abnormal   Collection Time: 01/12/15  2:15 PM  Result Value Ref Range   Tricyclic, Ur Screen NONE DETECTED NONE DETECTED   Amphetamines, Ur Screen NONE DETECTED NONE DETECTED   MDMA (Ecstasy)Ur Screen NONE DETECTED NONE DETECTED   Cocaine Metabolite,Ur [place] POSITIVE (A) NONE DETECTED   Opiate, Ur Screen NONE DETECTED NONE DETECTED   Phencyclidine (PCP) Ur S NONE DETECTED NONE DETECTED   Cannabinoid 50 Ng, Ur [place] NONE DETECTED NONE DETECTED   Barbiturates, Ur Screen  NONE DETECTED NONE DETECTED   Benzodiazepine, Ur Scrn NONE DETECTED NONE DETECTED   Methadone Scn, Ur NONE DETECTED NONE DETECTED  Urinalysis complete, with microscopic ([facility] only)     Status: Abnormal   Collection Time: 01/12/15  2:15 PM  Result Value Ref Range   Color, Urine AMBER (A) YELLOW   APPearance HAZY (A) CLEAR   Glucose, UA 50 (A) NEGATIVE mg/dL   Bilirubin Urine NEGATIVE NEGATIVE   Ketones, ur TRACE (A) NEGATIVE mg/dL   Specific Gravity, Urine 1.024 1.005 - 1.030   Hgb urine dipstick NEGATIVE NEGATIVE   pH 5.0 5.0 - 8.0   Protein, ur 100 (A) NEGATIVE mg/dL   Nitrite NEGATIVE NEGATIVE   Leukocytes, UA 3+ (A) NEGATIVE   RBC / HPF TOO NUMEROUS TO COUNT 0 - 5 RBC/hpf   WBC, UA TOO NUMEROUS TO COUNT 0 - 5 WBC/hpf   Bacteria, UA NONE SEEN NONE SEEN   Squamous Epithelial / LPF 0-5 (A) NONE SEEN   Mucous PRESENT   Protein / creatinine ratio, urine     Status: Abnormal   Collection Time: 01/12/15  2:15 PM  Result Value Ref Range   Creatinine, Urine 326 mg/dL   Total Protein, Urine 149 mg/dL   Protein Creatinine Ratio 0.46 (H) 0.00 - 0.15 mg/mg[Cre]  Glucose, capillary     Status: Abnormal   Collection Time: 01/12/15  5:55 PM  Result  Value Ref Range   Glucose-Capillary 191 (H) 65 - 99 mg/dL  Glucose, capillary     Status: Abnormal   Collection Time: 01/12/15  9:47 PM  Result Value Ref Range   Glucose-Capillary 233 (H) 65 - 99 mg/dL  Glucose, capillary     Status: Abnormal   Collection Time: 01/13/15  3:27 AM  Result Value Ref Range   Glucose-Capillary 214 (H) 65 - 99 mg/dL    Assessment:   39 y.o. [id number] [redacted]w[redacted]d s/p open appendectomy for ruptured appendix  Plan:  1) Ruptured appendix  - continue cefoxitin - monitor JP output - appreciated surgeries help in taking care of this patient  2) Fetus - category II tracing - will look for variability to improve postoperatively  - will see if DP can perform growth scan as well today with consult  3) DM II - unable to run insulin gtt on L&D  - NPH 15 units, along with sliding scale - If fetal well being becomes more reassuring no longer requiring continuous monitor may consider transfer to ICU for insulin gtt - repeat BMP to check AG postoperatively  3) CHTN  - goals BP >481 systolic <859 diastolic - continues on nifedipine for uterine irritability on admission

## 2015-01-13 NOTE — Progress Notes (Addendum)
Deeper variables, left lateral decubitus positioning, supplemental O2 being administered.  Repeat BMP pending to evaluate for development of anion gap pending.   Addendm 5:24 BMP reviewed electrolytes stable. No evidence of anion gap.  This was drawn prior the patient receiving 6 units of SSI and 15 of NPH.  Will continue to monitor and follow BMP particularly as I anticipate we may need to replete K.  Tacing improved with addition of supplemental O2 for now.
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
## 2015-01-13 NOTE — Progress Notes (Signed)
Surgery Progress Note  S: Pain to right of umbilicus.  Pelvic pain improved  O:Blood pressure 132/73, pulse 90, temperature 97.8 F (36.6 C), temperature source Oral, resp. rate 16, height 5\' 4"  (1.626 m), weight 181 lb (82.101 kg), SpO2 98 %. GEN: NAD/A&Ox3 ABD: soft, tender, JP with serosang cloudy fluid  WBC 20.8  A/P 39 yo s/p appendectomy for ruptured appendicitis with generalized purulence.  Pain improved - prenatal care per OB - cont drain - cont abx

## 2015-01-13 NOTE — Progress Notes (Signed)
[facility] life flight team here to transport patient to [facility], report given to them, pt transfer records printed, demographic and EMTALA form, all in envelope for transport.  Patient moved to stretcher. Patient still c/o pain, Ronalee Belts from team on phone with MD at [facility], order for pain medication given, morphine given by transport team. MD also requests vaginal exam prior to transport, patient refused consent for vag exam, deferred.

## 2015-01-13 NOTE — Transfer of Care (Signed)
Immediate Anesthesia Transfer of Care Note  Patient: Nichole Wallace Aslinger  Procedure(s) Performed: Procedure(s): APPENDECTOMY (N/A)  Patient Location: PACU  Anesthesia Type:General  Level of Consciousness: awake  Airway & Oxygen Therapy: Patient Spontanous Breathing  Post-op Assessment: Report given to RN  Post vital signs: Reviewed  Last Vitals:  Filed Vitals:   01/12/15 2251  BP: 135/93  Pulse: 99  Temp: 37.1 C  Resp: 18    Complications: No apparent anesthesia complications

## 2015-01-14 ENCOUNTER — Encounter: Payer: Self-pay | Admitting: Surgery

## 2015-01-14 LAB — SURGICAL PATHOLOGY

## 2015-01-14 NOTE — Anesthesia Postprocedure Evaluation (Signed)
  Anesthesia Post-op Note  Patient: Nichole Wallace  Procedure(s) Performed: Procedure(s): APPENDECTOMY (N/A)  Anesthesia type:General  Patient location: PACU  Post pain: Pain level controlled  Post assessment: Post-op Vital signs reviewed, Patient's Cardiovascular Status Stable, Respiratory Function Stable, Patent Airway and No signs of Nausea or vomiting  Post vital signs: Reviewed and stable  Last Vitals:  Filed Vitals:   01/13/15 0803  BP: 110/56  Pulse: 91  Temp: 36.8 C  Resp: 18    Level of consciousness: awake, alert  and patient cooperative  Complications: No apparent anesthesia complications

## 2015-01-15 ENCOUNTER — Telehealth: Payer: Self-pay | Admitting: Surgery

## 2015-01-15 DIAGNOSIS — G894 Chronic pain syndrome: Secondary | ICD-10-CM | POA: Diagnosis not present

## 2015-01-15 NOTE — Telephone Encounter (Signed)
Please call nurse back she is calling from [facility], She needs to know about patients after care for drain and how soon patient needs to come back for Post op.Marland KitchenMarland KitchenMarland Kitchen

## 2015-01-15 NOTE — Telephone Encounter (Signed)
Called number back at this time. No answer. Left voicemail for return phone call.

## 2015-01-16 NOTE — Telephone Encounter (Signed)
Spoke with Nurse Kayleen Memos) once again and post-op appointment was made for 01/23/15. Anticipated d/c date will be 8/27 or 8/28.   Verbal orders given to d/c penrose and to consult surgery so that they may lay eyes on patient and give further instruction.  Read back completed on the phone at that time.   Nurse will call back with any further problems.

## 2015-01-16 NOTE — Telephone Encounter (Signed)
Spoke with Kayleen Memos (Nurse at [facility]) at this time to get more information regarding this patient. Apparently, pt had an Open Appendectomy on 8/22 with Dr. Rexene Edison and 2 penrose drains and a JP drain.   She was then transferred to [facility] for glucose control as this could not be controlled at [facility]   According to nurse today, JP has drained 60cc output and dressing over the penrose drains has no drainage.  Nurse is calling to find out if drains may be pulled or how they would be managed outpatient as well as when post-op appointment should be made with our office.  A discharge date has yet to be finalized but is projected for the next 1-3 days per nurse.   Explained that I would have to speak with our surgeon that is here and return phone call after rounds at their facility have been completed this morning. She verbalizes understanding of this.

## 2015-01-18 DIAGNOSIS — Z3A31 31 weeks gestation of pregnancy: Secondary | ICD-10-CM | POA: Diagnosis not present

## 2015-01-18 DIAGNOSIS — O99323 Drug use complicating pregnancy, third trimester: Secondary | ICD-10-CM | POA: Diagnosis not present

## 2015-01-18 DIAGNOSIS — O10013 Pre-existing essential hypertension complicating pregnancy, third trimester: Secondary | ICD-10-CM | POA: Diagnosis not present

## 2015-01-18 DIAGNOSIS — O09513 Supervision of elderly primigravida, third trimester: Secondary | ICD-10-CM | POA: Diagnosis not present

## 2015-01-18 DIAGNOSIS — G8921 Chronic pain due to trauma: Secondary | ICD-10-CM | POA: Diagnosis not present

## 2015-01-19 DIAGNOSIS — O09513 Supervision of elderly primigravida, third trimester: Secondary | ICD-10-CM | POA: Diagnosis not present

## 2015-01-19 DIAGNOSIS — Z3A31 31 weeks gestation of pregnancy: Secondary | ICD-10-CM | POA: Diagnosis not present

## 2015-01-19 DIAGNOSIS — O10013 Pre-existing essential hypertension complicating pregnancy, third trimester: Secondary | ICD-10-CM | POA: Diagnosis not present

## 2015-01-19 DIAGNOSIS — G8921 Chronic pain due to trauma: Secondary | ICD-10-CM | POA: Diagnosis not present

## 2015-01-19 DIAGNOSIS — O99323 Drug use complicating pregnancy, third trimester: Secondary | ICD-10-CM | POA: Diagnosis not present

## 2015-01-20 DIAGNOSIS — I519 Heart disease, unspecified: Secondary | ICD-10-CM | POA: Diagnosis not present

## 2015-01-21 DIAGNOSIS — G8921 Chronic pain due to trauma: Secondary | ICD-10-CM | POA: Diagnosis not present

## 2015-01-21 DIAGNOSIS — O09513 Supervision of elderly primigravida, third trimester: Secondary | ICD-10-CM | POA: Diagnosis not present

## 2015-01-21 DIAGNOSIS — I252 Old myocardial infarction: Secondary | ICD-10-CM | POA: Diagnosis not present

## 2015-01-21 DIAGNOSIS — Z302 Encounter for sterilization: Secondary | ICD-10-CM | POA: Insufficient documentation

## 2015-01-21 DIAGNOSIS — O24113 Pre-existing diabetes mellitus, type 2, in pregnancy, third trimester: Secondary | ICD-10-CM | POA: Diagnosis not present

## 2015-01-22 DIAGNOSIS — I252 Old myocardial infarction: Secondary | ICD-10-CM | POA: Diagnosis not present

## 2015-01-22 DIAGNOSIS — Z3A31 31 weeks gestation of pregnancy: Secondary | ICD-10-CM | POA: Diagnosis not present

## 2015-01-22 DIAGNOSIS — G8921 Chronic pain due to trauma: Secondary | ICD-10-CM | POA: Diagnosis not present

## 2015-01-22 DIAGNOSIS — O09513 Supervision of elderly primigravida, third trimester: Secondary | ICD-10-CM | POA: Diagnosis not present

## 2015-01-22 DIAGNOSIS — O24113 Pre-existing diabetes mellitus, type 2, in pregnancy, third trimester: Secondary | ICD-10-CM | POA: Diagnosis not present

## 2015-01-23 ENCOUNTER — Encounter: Payer: Self-pay | Admitting: Surgery

## 2015-01-23 DIAGNOSIS — Z3A31 31 weeks gestation of pregnancy: Secondary | ICD-10-CM | POA: Diagnosis not present

## 2015-01-23 DIAGNOSIS — R739 Hyperglycemia, unspecified: Secondary | ICD-10-CM | POA: Diagnosis not present

## 2015-01-23 DIAGNOSIS — I252 Old myocardial infarction: Secondary | ICD-10-CM | POA: Diagnosis not present

## 2015-01-23 DIAGNOSIS — O24113 Pre-existing diabetes mellitus, type 2, in pregnancy, third trimester: Secondary | ICD-10-CM | POA: Diagnosis not present

## 2015-01-23 DIAGNOSIS — O09513 Supervision of elderly primigravida, third trimester: Secondary | ICD-10-CM | POA: Diagnosis not present

## 2015-01-23 DIAGNOSIS — G8921 Chronic pain due to trauma: Secondary | ICD-10-CM | POA: Diagnosis not present

## 2015-01-24 DIAGNOSIS — I252 Old myocardial infarction: Secondary | ICD-10-CM | POA: Diagnosis not present

## 2015-01-24 DIAGNOSIS — G8918 Other acute postprocedural pain: Secondary | ICD-10-CM | POA: Diagnosis not present

## 2015-01-24 DIAGNOSIS — G8921 Chronic pain due to trauma: Secondary | ICD-10-CM | POA: Diagnosis not present

## 2015-01-24 DIAGNOSIS — O09513 Supervision of elderly primigravida, third trimester: Secondary | ICD-10-CM | POA: Diagnosis not present

## 2015-01-24 DIAGNOSIS — O24113 Pre-existing diabetes mellitus, type 2, in pregnancy, third trimester: Secondary | ICD-10-CM | POA: Diagnosis not present

## 2015-01-24 DIAGNOSIS — N1330 Unspecified hydronephrosis: Secondary | ICD-10-CM | POA: Diagnosis not present

## 2015-01-25 DIAGNOSIS — O09513 Supervision of elderly primigravida, third trimester: Secondary | ICD-10-CM | POA: Diagnosis not present

## 2015-01-25 DIAGNOSIS — I213 ST elevation (STEMI) myocardial infarction of unspecified site: Secondary | ICD-10-CM | POA: Diagnosis not present

## 2015-01-25 DIAGNOSIS — O10013 Pre-existing essential hypertension complicating pregnancy, third trimester: Secondary | ICD-10-CM | POA: Diagnosis not present

## 2015-01-26 DIAGNOSIS — Z3A32 32 weeks gestation of pregnancy: Secondary | ICD-10-CM | POA: Diagnosis not present

## 2015-01-26 DIAGNOSIS — E118 Type 2 diabetes mellitus with unspecified complications: Secondary | ICD-10-CM | POA: Diagnosis not present

## 2015-01-26 DIAGNOSIS — K352 Acute appendicitis with generalized peritonitis: Secondary | ICD-10-CM | POA: Diagnosis not present

## 2015-01-26 DIAGNOSIS — G8921 Chronic pain due to trauma: Secondary | ICD-10-CM | POA: Diagnosis not present

## 2015-01-26 DIAGNOSIS — O99323 Drug use complicating pregnancy, third trimester: Secondary | ICD-10-CM | POA: Diagnosis not present

## 2015-01-26 DIAGNOSIS — O09513 Supervision of elderly primigravida, third trimester: Secondary | ICD-10-CM | POA: Diagnosis not present

## 2015-01-26 DIAGNOSIS — O99513 Diseases of the respiratory system complicating pregnancy, third trimester: Secondary | ICD-10-CM | POA: Diagnosis not present

## 2015-01-26 DIAGNOSIS — K651 Peritoneal abscess: Secondary | ICD-10-CM | POA: Diagnosis not present

## 2015-01-26 DIAGNOSIS — F419 Anxiety disorder, unspecified: Secondary | ICD-10-CM | POA: Diagnosis not present

## 2015-01-26 DIAGNOSIS — O1410 Severe pre-eclampsia, unspecified trimester: Secondary | ICD-10-CM | POA: Diagnosis not present

## 2015-01-26 DIAGNOSIS — I252 Old myocardial infarction: Secondary | ICD-10-CM | POA: Diagnosis not present

## 2015-01-26 DIAGNOSIS — Z3A30 30 weeks gestation of pregnancy: Secondary | ICD-10-CM | POA: Diagnosis not present

## 2015-01-26 DIAGNOSIS — O10013 Pre-existing essential hypertension complicating pregnancy, third trimester: Secondary | ICD-10-CM | POA: Diagnosis not present

## 2015-01-28 DIAGNOSIS — R109 Unspecified abdominal pain: Secondary | ICD-10-CM | POA: Diagnosis not present

## 2015-01-28 DIAGNOSIS — G8918 Other acute postprocedural pain: Secondary | ICD-10-CM | POA: Diagnosis not present

## 2015-01-29 DIAGNOSIS — R109 Unspecified abdominal pain: Secondary | ICD-10-CM | POA: Diagnosis not present

## 2015-01-29 DIAGNOSIS — G8918 Other acute postprocedural pain: Secondary | ICD-10-CM | POA: Diagnosis not present

## 2015-02-04 DIAGNOSIS — Z4889 Encounter for other specified surgical aftercare: Secondary | ICD-10-CM | POA: Diagnosis not present

## 2015-02-04 DIAGNOSIS — Z4801 Encounter for change or removal of surgical wound dressing: Secondary | ICD-10-CM | POA: Diagnosis not present

## 2015-02-05 ENCOUNTER — Other Ambulatory Visit: Payer: Self-pay | Admitting: *Deleted

## 2015-02-05 ENCOUNTER — Encounter: Payer: Self-pay | Admitting: *Deleted

## 2015-02-05 DIAGNOSIS — O860 Infection of obstetric surgical wound: Secondary | ICD-10-CM | POA: Diagnosis not present

## 2015-02-07 ENCOUNTER — Encounter: Payer: Self-pay | Admitting: *Deleted

## 2015-02-07 DIAGNOSIS — Z4801 Encounter for change or removal of surgical wound dressing: Secondary | ICD-10-CM | POA: Diagnosis not present

## 2015-02-07 DIAGNOSIS — Z4889 Encounter for other specified surgical aftercare: Secondary | ICD-10-CM | POA: Diagnosis not present

## 2015-02-08 NOTE — Addendum Note (Signed)
Addendum  created 02/08/15 1352 by Kallie Edward, CRNA   Modules edited: Anesthesia Attestations, Anesthesia Responsible Staff, Narrator   Narrator:  Narrator: Event Log Edited

## 2015-02-10 ENCOUNTER — Ambulatory Visit: Payer: Self-pay | Admitting: Surgery

## 2015-02-11 DIAGNOSIS — Z4801 Encounter for change or removal of surgical wound dressing: Secondary | ICD-10-CM | POA: Diagnosis not present

## 2015-02-11 DIAGNOSIS — Z4889 Encounter for other specified surgical aftercare: Secondary | ICD-10-CM | POA: Diagnosis not present

## 2015-02-15 DIAGNOSIS — Z4801 Encounter for change or removal of surgical wound dressing: Secondary | ICD-10-CM | POA: Diagnosis not present

## 2015-02-15 DIAGNOSIS — Z4889 Encounter for other specified surgical aftercare: Secondary | ICD-10-CM | POA: Diagnosis not present

## 2015-02-26 DIAGNOSIS — T8189XA Other complications of procedures, not elsewhere classified, initial encounter: Secondary | ICD-10-CM | POA: Diagnosis not present

## 2015-02-26 DIAGNOSIS — O900 Disruption of cesarean delivery wound: Secondary | ICD-10-CM | POA: Diagnosis not present

## 2015-02-28 DIAGNOSIS — F411 Generalized anxiety disorder: Secondary | ICD-10-CM | POA: Diagnosis not present

## 2015-02-28 DIAGNOSIS — F4310 Post-traumatic stress disorder, unspecified: Secondary | ICD-10-CM | POA: Diagnosis not present

## 2015-03-18 DIAGNOSIS — F411 Generalized anxiety disorder: Secondary | ICD-10-CM | POA: Diagnosis not present

## 2015-03-18 DIAGNOSIS — F4310 Post-traumatic stress disorder, unspecified: Secondary | ICD-10-CM | POA: Diagnosis not present

## 2015-03-25 DIAGNOSIS — F411 Generalized anxiety disorder: Secondary | ICD-10-CM | POA: Diagnosis not present

## 2015-03-25 DIAGNOSIS — F4310 Post-traumatic stress disorder, unspecified: Secondary | ICD-10-CM | POA: Diagnosis not present

## 2015-03-31 DIAGNOSIS — F321 Major depressive disorder, single episode, moderate: Secondary | ICD-10-CM | POA: Diagnosis not present

## 2015-04-02 DIAGNOSIS — M1288 Other specific arthropathies, not elsewhere classified, other specified site: Secondary | ICD-10-CM | POA: Diagnosis not present

## 2015-04-02 DIAGNOSIS — G894 Chronic pain syndrome: Secondary | ICD-10-CM | POA: Diagnosis not present

## 2015-04-02 DIAGNOSIS — M5090 Cervical disc disorder, unspecified, unspecified cervical region: Secondary | ICD-10-CM | POA: Diagnosis not present

## 2015-04-18 DIAGNOSIS — F411 Generalized anxiety disorder: Secondary | ICD-10-CM | POA: Diagnosis not present

## 2015-04-18 DIAGNOSIS — F4310 Post-traumatic stress disorder, unspecified: Secondary | ICD-10-CM | POA: Diagnosis not present

## 2015-04-25 DIAGNOSIS — F4310 Post-traumatic stress disorder, unspecified: Secondary | ICD-10-CM | POA: Diagnosis not present

## 2015-04-25 DIAGNOSIS — F411 Generalized anxiety disorder: Secondary | ICD-10-CM | POA: Diagnosis not present

## 2015-05-29 DIAGNOSIS — F4310 Post-traumatic stress disorder, unspecified: Secondary | ICD-10-CM | POA: Diagnosis not present

## 2015-05-29 DIAGNOSIS — F411 Generalized anxiety disorder: Secondary | ICD-10-CM | POA: Diagnosis not present

## 2015-07-14 DIAGNOSIS — O99331 Smoking (tobacco) complicating pregnancy, first trimester: Secondary | ICD-10-CM | POA: Diagnosis not present

## 2015-07-14 DIAGNOSIS — O2621 Pregnancy care for patient with recurrent pregnancy loss, first trimester: Secondary | ICD-10-CM | POA: Diagnosis not present

## 2015-07-14 DIAGNOSIS — O99341 Other mental disorders complicating pregnancy, first trimester: Secondary | ICD-10-CM | POA: Diagnosis not present

## 2015-07-14 DIAGNOSIS — F17210 Nicotine dependence, cigarettes, uncomplicated: Secondary | ICD-10-CM | POA: Diagnosis not present

## 2015-07-14 DIAGNOSIS — Z3201 Encounter for pregnancy test, result positive: Secondary | ICD-10-CM | POA: Diagnosis not present

## 2015-07-14 DIAGNOSIS — Z3A08 8 weeks gestation of pregnancy: Secondary | ICD-10-CM | POA: Diagnosis not present

## 2015-07-14 DIAGNOSIS — O4691 Antepartum hemorrhage, unspecified, first trimester: Secondary | ICD-10-CM | POA: Diagnosis not present

## 2015-07-14 DIAGNOSIS — F419 Anxiety disorder, unspecified: Secondary | ICD-10-CM | POA: Diagnosis not present

## 2015-07-14 DIAGNOSIS — Z955 Presence of coronary angioplasty implant and graft: Secondary | ICD-10-CM | POA: Diagnosis not present

## 2015-07-14 DIAGNOSIS — E119 Type 2 diabetes mellitus without complications: Secondary | ICD-10-CM | POA: Diagnosis not present

## 2015-07-14 DIAGNOSIS — I252 Old myocardial infarction: Secondary | ICD-10-CM | POA: Diagnosis not present

## 2015-07-14 DIAGNOSIS — I2510 Atherosclerotic heart disease of native coronary artery without angina pectoris: Secondary | ICD-10-CM | POA: Diagnosis not present

## 2015-07-14 DIAGNOSIS — O24111 Pre-existing diabetes mellitus, type 2, in pregnancy, first trimester: Secondary | ICD-10-CM | POA: Diagnosis not present

## 2015-07-14 DIAGNOSIS — O09511 Supervision of elderly primigravida, first trimester: Secondary | ICD-10-CM | POA: Diagnosis not present

## 2015-07-14 DIAGNOSIS — O99411 Diseases of the circulatory system complicating pregnancy, first trimester: Secondary | ICD-10-CM | POA: Diagnosis not present

## 2015-07-14 DIAGNOSIS — O209 Hemorrhage in early pregnancy, unspecified: Secondary | ICD-10-CM | POA: Diagnosis not present

## 2015-07-14 DIAGNOSIS — O10911 Unspecified pre-existing hypertension complicating pregnancy, first trimester: Secondary | ICD-10-CM | POA: Diagnosis not present

## 2015-07-14 DIAGNOSIS — F329 Major depressive disorder, single episode, unspecified: Secondary | ICD-10-CM | POA: Diagnosis not present

## 2015-07-23 DIAGNOSIS — O2343 Unspecified infection of urinary tract in pregnancy, third trimester: Secondary | ICD-10-CM | POA: Diagnosis not present

## 2015-07-23 DIAGNOSIS — O99323 Drug use complicating pregnancy, third trimester: Secondary | ICD-10-CM | POA: Diagnosis not present

## 2015-07-23 DIAGNOSIS — A599 Trichomoniasis, unspecified: Secondary | ICD-10-CM | POA: Diagnosis not present

## 2015-07-23 DIAGNOSIS — R319 Hematuria, unspecified: Secondary | ICD-10-CM | POA: Diagnosis not present

## 2015-08-27 ENCOUNTER — Encounter: Payer: Self-pay | Admitting: Obstetrics and Gynecology
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
## 2015-09-17 ENCOUNTER — Encounter: Payer: Medicaid Other | Admitting: Obstetrics and Gynecology

## 2015-11-17 ENCOUNTER — Encounter (HOSPITAL_COMMUNITY): Payer: Self-pay | Admitting: *Deleted

## 2015-12-02 ENCOUNTER — Ambulatory Visit (INDEPENDENT_AMBULATORY_CARE_PROVIDER_SITE_OTHER): Payer: Medicare Other | Admitting: Obstetrics and Gynecology

## 2015-12-02 ENCOUNTER — Encounter: Payer: Self-pay | Admitting: Obstetrics and Gynecology

## 2015-12-02 VITALS — BP 130/74 | HR 95 | Wt 170.0 lb

## 2015-12-02 DIAGNOSIS — Z8679 Personal history of other diseases of the circulatory system: Secondary | ICD-10-CM

## 2015-12-02 DIAGNOSIS — Z98891 History of uterine scar from previous surgery: Secondary | ICD-10-CM | POA: Diagnosis not present

## 2015-12-02 DIAGNOSIS — Z87898 Personal history of other specified conditions: Secondary | ICD-10-CM | POA: Diagnosis not present

## 2015-12-02 DIAGNOSIS — Z131 Encounter for screening for diabetes mellitus: Secondary | ICD-10-CM | POA: Diagnosis not present

## 2015-12-02 DIAGNOSIS — Z3492 Encounter for supervision of normal pregnancy, unspecified, second trimester: Secondary | ICD-10-CM | POA: Diagnosis not present

## 2015-12-02 DIAGNOSIS — Z720 Tobacco use: Secondary | ICD-10-CM

## 2015-12-02 DIAGNOSIS — O0932 Supervision of pregnancy with insufficient antenatal care, second trimester: Secondary | ICD-10-CM

## 2015-12-02 DIAGNOSIS — F329 Major depressive disorder, single episode, unspecified: Secondary | ICD-10-CM

## 2015-12-02 DIAGNOSIS — O09522 Supervision of elderly multigravida, second trimester: Secondary | ICD-10-CM

## 2015-12-02 DIAGNOSIS — I252 Old myocardial infarction: Secondary | ICD-10-CM

## 2015-12-02 DIAGNOSIS — Z8759 Personal history of other complications of pregnancy, childbirth and the puerperium: Secondary | ICD-10-CM | POA: Diagnosis not present

## 2015-12-02 DIAGNOSIS — F32A Depression, unspecified: Secondary | ICD-10-CM

## 2015-12-02 NOTE — Progress Notes (Signed)
Nob PE- no prenatal care.

## 2015-12-03 ENCOUNTER — Other Ambulatory Visit: Payer: Medicare Other

## 2015-12-03 DIAGNOSIS — Z3492 Encounter for supervision of normal pregnancy, unspecified, second trimester: Secondary | ICD-10-CM | POA: Diagnosis not present

## 2015-12-03 DIAGNOSIS — Z8759 Personal history of other complications of pregnancy, childbirth and the puerperium: Secondary | ICD-10-CM | POA: Insufficient documentation

## 2015-12-03 DIAGNOSIS — Z98891 History of uterine scar from previous surgery: Secondary | ICD-10-CM | POA: Insufficient documentation

## 2015-12-03 DIAGNOSIS — O09529 Supervision of elderly multigravida, unspecified trimester: Secondary | ICD-10-CM | POA: Insufficient documentation

## 2015-12-03 DIAGNOSIS — Z87898 Personal history of other specified conditions: Secondary | ICD-10-CM | POA: Insufficient documentation

## 2015-12-03 DIAGNOSIS — F32A Depression, unspecified: Secondary | ICD-10-CM | POA: Insufficient documentation

## 2015-12-03 DIAGNOSIS — Z720 Tobacco use: Secondary | ICD-10-CM | POA: Insufficient documentation

## 2015-12-03 DIAGNOSIS — Z131 Encounter for screening for diabetes mellitus: Secondary | ICD-10-CM | POA: Insufficient documentation

## 2015-12-03 DIAGNOSIS — F332 Major depressive disorder, recurrent severe without psychotic features: Secondary | ICD-10-CM | POA: Diagnosis not present

## 2015-12-03 DIAGNOSIS — O0930 Supervision of pregnancy with insufficient antenatal care, unspecified trimester: Secondary | ICD-10-CM | POA: Insufficient documentation

## 2015-12-03 DIAGNOSIS — Z8679 Personal history of other diseases of the circulatory system: Secondary | ICD-10-CM | POA: Insufficient documentation

## 2015-12-03 DIAGNOSIS — F329 Major depressive disorder, single episode, unspecified: Secondary | ICD-10-CM | POA: Insufficient documentation

## 2015-12-03 DIAGNOSIS — I252 Old myocardial infarction: Secondary | ICD-10-CM | POA: Insufficient documentation

## 2015-12-03 NOTE — Progress Notes (Signed)
GYN ENCOUNTER NOTE  Subjective:       Nichole Wallace is a 40 y.o. [phone] female is here for gynecologic evaluation of the following issues:  1.Prenatal care consultation  Early ultrasound- confirms EDD (not verified in records at today's visit)   Gynecologic History Patient's last menstrual period was 05/17/2015. Contraception: none  Obstetric History OB History  Gravida Para Term Preterm AB SAB TAB Ectopic Multiple Living  8 2 0 2 5 5 0 0 0 1     # Outcome Date GA Lbr Len/2nd Weight Sex Delivery Anes PTL Lv  8 Current           7 Preterm 2016   3 lb 3.2 oz (1.452 kg) M CS-LTranv  N Y  6 SAB           5 SAB           4 SAB           3 SAB           2 SAB           1 Preterm         FD      Past Medical History  Diagnosis Date  . Hypertension   . Preterm labor     6 losses early on  . Diabetes mellitus without complication ([place])   . Depression   . Anxiety   . Myocardial infarction ([place])   . Appendicitis   . Heart attack ([place]) 04/09/2013    Overview:  History per patient   . CAD in native artery 10/02/2014  . Ruptured appendix   . Diabetes ([place]) 04/09/2013  . Lumbar disc disease with radiculopathy 11/30/2013  . Cervical spinal cord compression ([place]) 07/04/2013  . S/P coronary artery balloon dilation 04/09/2013    Overview:  Overview:  History per patient   . Chronic pain 11/28/2013  . Cocaine abuse     Past Surgical History  Procedure Laterality Date  . Tonsillectomy    . Coronary angioplasty with stent placement    . Gallbladder surgery    . Neck fusion    . Appendectomy N/A 01/12/2015    Procedure: APPENDECTOMY;  Surgeon: Marlyce Huge, MD;  Location: [facility] ORS;  Service: General;  Laterality: N/A;  . Dilation and curettage of uterus    . Cesarean section  01/26/2015    [facility]    No current outpatient prescriptions on file prior to visit.   No current facility-administered medications on file prior to visit.    Allergies  Allergen Reactions  .  Other Shortness Of Breath    Pine nuts and anything pine  . Erythromycin Other (See Comments)    States she is unsure of reaction to it  . Erythromycin Base Rash    Does not remember what happens.  . Latex Itching  . Lorazepam Anxiety    AGGRESSION Other reaction(s): Anxiety AGGRESSION  . Metoclopramide Anxiety    "I want to punch somebody"    Social History   Social History  . Marital Status: Single    Spouse Name: N/A  . Number of Children: N/A  . Years of Education: N/A   Occupational History  . Not on file.   Social History Main Topics  . Smoking status: Current Every Day Smoker -- 0.25 packs/day for 15 years    Types: Cigarettes  . Smokeless tobacco: Never Used  . Alcohol Use: No     Comment: oxycontin daily for neck pain  .  Drug Use: Yes    Special: Other-see comments, Cocaine     Comment: used about 4 months ago  . Sexual Activity: Yes    Birth Control/ Protection: None   Other Topics Concern  . Not on file   Social History Narrative    Family History  Problem Relation Age of Onset  . Cancer Father   . Hepatitis B Father   . Hepatitis C Father   . Diabetes Mother   . Hepatitis B Mother   . Hepatitis C Mother   . Osteoarthritis Mother     The following portions of the patient's history were reviewed and updated as appropriate: allergies, current medications, past family history, past medical history, past social history, past surgical history and problem list.  Review of Systems Good fetal movement No vaginal bleeding or discharge No headaches scotomata leg swelling No polyuria, polydipsia, polyphagia No current insulin therapy Tobacco use reportedly 1-2 cigarettes per week Denies substance abuse No current medication therapy  BP 130/74 mmHg  Pulse 95  Wt 170 lb (77.111 kg)  LMP 05/17/2015 CONSTITUTIONAL: Well-developed, well-nourished female in no acute distress. Appears older than stated age HENT:  Normocephalic, atraumatic.  NECK:  Normal range of motion, supple, no masses.  Normal thyroid.  SKIN: Skin is warm and dry. No rash noted. Not diaphoretic. No erythema. No pallor. [place]: Alert and oriented to person, place, and time. PSYCHIATRIC: Normal mood and affect. Normal behavior. Normal judgment and thought content. CARDIOVASCULAR:Not Examined RESPIRATORY: Not Examined BREASTS: Not Examined ABDOMEN: Soft, non distended; Non tender.  Uterus measures 24 cm. Fetal heart rate 150 by Doppler. Midline incision healed with ventral hernia present; right lower quadrant appendectomy scar healed; pannus present PELVIC: Deferred MUSCULOSKELETAL: Normal range of motion. No tenderness.  No cyanosis, clubbing, or edema.     Assessment:   1. Supervision of normal pregnancy, second trimester - ABO AND RH  - CBC with Differential/Platelet - Antibody screen - Culture, OB Urine - GC/Chlamydia Probe Amp - Hepatitis B surface antigen - HIV antibody - Monitor Drug Profile 14(MW) - Nicotine screen, urine - RPR - Rubella screen - Urinalysis, Routine w reflex microscopic (not at [facility]) - Varicella zoster antibody, IgG  2. DM (diabetes mellitus screen) - Hemoglobin A1c - Thyroid Panel With TSH - Ambulatory referral to Perinatology  3. History of chronic hypertension, Currently off medication - Ambulatory referral to Perinatology  4. History of MI (myocardial infarction), status post angioplasty and stent placement - Ambulatory referral to Perinatology  5. History of more than 3 abortions - Ambulatory referral to Perinatology  6. Tobacco user  7. History of substance abuse-cocaine  8. History of cesarean section, previously delivered at [facility]  9. Advanced maternal age  66. History of late prenatal care  66. History of depression       Plan:   1. Maintain blood glucose log (patient states her blood sugars currently range from 85-127) 2. Start baby aspirin 81 mg daily day; Continue prenatal  vitamin 3. Referral to [facility] for transfer of care to maternal fetal medicine practice/tertiary care 4. Patient understands the high-risk nature of her pregnancy which requires maternal fetal medicine management  A total of 30 minutes were spent face-to-face with the patient during the encounter with greater than 50% dealing with counseling and coordination of care.  Brayton Mars, MD  Note: This dictation was prepared with Dragon dictation along with smaller phrase technology. Any transcriptional errors that result from this process are unintentional.

## 2015-12-03 NOTE — Patient Instructions (Signed)
1. Continue taking prenatal vitamins 2. Start baby aspirin 81 mg a day 3. Maintain blood sugar log 4. Referral to [facility] for transfer of care

## 2015-12-04 LAB — RPR: RPR Ser Ql: NONREACTIVE

## 2015-12-04 LAB — HEPATITIS B SURFACE ANTIGEN: HEP B S AG: NEGATIVE

## 2015-12-04 LAB — CBC WITH DIFFERENTIAL/PLATELET
BASOS: 0 %
Basophils Absolute: 0 10*3/uL (ref 0.0–0.2)
EOS (ABSOLUTE): 0.3 10*3/uL (ref 0.0–0.4)
Eos: 2 %
HEMATOCRIT: 35 % (ref 34.0–46.6)
Hemoglobin: 12.2 g/dL (ref 11.1–15.9)
IMMATURE GRANS (ABS): 0 10*3/uL (ref 0.0–0.1)
Immature Granulocytes: 0 %
LYMPHS: 30 %
Lymphocytes Absolute: 3.6 10*3/uL — ABNORMAL HIGH (ref 0.7–3.1)
MCH: 31.9 pg (ref 26.6–33.0)
MCHC: 34.9 g/dL (ref 31.5–35.7)
MCV: 92 fL (ref 79–97)
Monocytes Absolute: 0.6 10*3/uL (ref 0.1–0.9)
Monocytes: 5 %
NEUTROS ABS: 7.5 10*3/uL — AB (ref 1.4–7.0)
Neutrophils: 63 %
PLATELETS: 318 10*3/uL (ref 150–379)
RBC: 3.82 x10E6/uL (ref 3.77–5.28)
RDW: 12.5 % (ref 12.3–15.4)
WBC: 12 10*3/uL — ABNORMAL HIGH (ref 3.4–10.8)

## 2015-12-04 LAB — RUBELLA SCREEN: Rubella Antibodies, IGG: 7.51 index (ref >0.99)

## 2015-12-04 LAB — GC/CHLAMYDIA PROBE AMP
Chlamydia trachomatis, NAA: NEGATIVE
NEISSERIA GONORRHOEAE BY PCR: NEGATIVE

## 2015-12-04 LAB — THYROID PANEL WITH TSH
FREE THYROXINE INDEX: 1.2 (ref 1.2–4.9)
T3 UPTAKE RATIO: 12 % — AB (ref 24–39)
T4 TOTAL: 10.3 ug/dL (ref 4.5–12.0)
TSH: 2.57 u[IU]/mL (ref 0.450–4.500)

## 2015-12-04 LAB — VARICELLA ZOSTER ANTIBODY, IGG: VARICELLA: 1645 {index} (ref >165)

## 2015-12-04 LAB — HEMOGLOBIN A1C
Est. average glucose Bld gHb Est-mCnc: 140 mg/dL
Hgb A1c MFr Bld: 6.5 % — ABNORMAL HIGH (ref 4.8–5.6)

## 2015-12-04 LAB — ABO AND RH: Rh Factor: POSITIVE

## 2015-12-04 LAB — URINE CULTURE, OB REFLEX

## 2015-12-04 LAB — CULTURE, OB URINE

## 2015-12-04 LAB — HIV ANTIBODY (ROUTINE TESTING W REFLEX): HIV SCREEN 4TH GENERATION: NONREACTIVE

## 2015-12-04 LAB — ANTIBODY SCREEN: Antibody Screen: NEGATIVE

## 2015-12-08 LAB — URINALYSIS, ROUTINE W REFLEX MICROSCOPIC
BILIRUBIN UA: NEGATIVE
Glucose, UA: NEGATIVE
KETONES UA: NEGATIVE
NITRITE UA: NEGATIVE
RBC UA: NEGATIVE
SPEC GRAV UA: 1.014 (ref 1.005–1.030)
Urobilinogen, Ur: 1 mg/dL (ref 0.2–1.0)
pH, UA: 7.5 (ref 5.0–7.5)

## 2015-12-08 LAB — MICROSCOPIC EXAMINATION: Casts: NONE SEEN /lpf

## 2015-12-08 LAB — MONITOR DRUG PROFILE 14(MW)
Amphetamine Scrn, Ur: NEGATIVE ng/mL
BARBITURATE SCREEN URINE: NEGATIVE ng/mL
BENZODIAZEPINE SCREEN, URINE: NEGATIVE ng/mL
BUPRENORPHINE, URINE: NEGATIVE ng/mL
CANNABINOIDS UR QL SCN: NEGATIVE ng/mL
Creatinine(Crt), U: 87.9 mg/dL (ref 20.0–300.0)
Fentanyl, Urine: NEGATIVE pg/mL
MEPERIDINE SCREEN, URINE: NEGATIVE ng/mL
Methadone Screen, Urine: NEGATIVE ng/mL
OPIATE SCREEN URINE: NEGATIVE ng/mL
OXYCODONE+OXYMORPHONE UR QL SCN: NEGATIVE ng/mL
PHENCYCLIDINE QUANTITATIVE URINE: NEGATIVE ng/mL
PROPOXYPHENE SCREEN URINE: NEGATIVE ng/mL
Ph of Urine: 7.5 (ref 4.5–8.9)
SPECIFIC GRAVITY: 1.018
TRAMADOL SCREEN, URINE: NEGATIVE ng/mL

## 2015-12-08 LAB — COCAINE (GC/MS), URINE
BENZOYLECGONINE (GC/MS): 339 ng/mL
COCAINE + METABOLITE: POSITIVE — AB

## 2015-12-08 LAB — NICOTINE SCREEN, URINE: Cotinine Ql Scrn, Ur: POSITIVE ng/mL

## 2015-12-09 ENCOUNTER — Encounter: Payer: Self-pay | Admitting: Obstetrics and Gynecology

## 2015-12-09 DIAGNOSIS — R825 Elevated urine levels of drugs, medicaments and biological substances: Secondary | ICD-10-CM | POA: Insufficient documentation

## 2015-12-15 ENCOUNTER — Ambulatory Visit
Admission: RE | Admit: 2015-12-15 | Discharge: 2015-12-15 | Disposition: A | Discharge status: Home / Self Care | Payer: Medicare Other | Source: Ambulatory Visit | Attending: Family Medicine | Admitting: Family Medicine

## 2015-12-15 DIAGNOSIS — Z955 Presence of coronary angioplasty implant and graft: Secondary | ICD-10-CM

## 2015-12-15 DIAGNOSIS — O09513 Supervision of elderly primigravida, third trimester: Secondary | ICD-10-CM

## 2015-12-15 DIAGNOSIS — O24913 Unspecified diabetes mellitus in pregnancy, third trimester: Secondary | ICD-10-CM

## 2015-12-15 DIAGNOSIS — O10912 Unspecified pre-existing hypertension complicating pregnancy, second trimester: Secondary | ICD-10-CM

## 2015-12-15 DIAGNOSIS — I252 Old myocardial infarction: Secondary | ICD-10-CM

## 2015-12-15 NOTE — Progress Notes (Signed)
Patient did not present for scheduled MFM consult.

## 2015-12-25 ENCOUNTER — Ambulatory Visit
Admission: RE | Admit: 2015-12-25 | Discharge: 2015-12-25 | Disposition: A | Discharge status: Home / Self Care | Payer: Medicare Other | Source: Ambulatory Visit | Attending: Obstetrics & Gynecology | Admitting: Obstetrics & Gynecology

## 2015-12-25 ENCOUNTER — Telehealth: Payer: Self-pay

## 2015-12-25 VITALS — BP 135/86 | HR 76 | Temp 97.7°F | Resp 18 | Wt 176.0 lb

## 2015-12-25 DIAGNOSIS — Z955 Presence of coronary angioplasty implant and graft: Secondary | ICD-10-CM

## 2015-12-25 DIAGNOSIS — O09522 Supervision of elderly multigravida, second trimester: Secondary | ICD-10-CM | POA: Diagnosis not present

## 2015-12-25 DIAGNOSIS — I252 Old myocardial infarction: Secondary | ICD-10-CM

## 2015-12-25 MED ORDER — ENOXAPARIN SODIUM 40 MG/0.4ML ~~LOC~~ SOLN: 40.0000 mg | SUBCUTANEOUS | 4 refills | Status: AC

## 2015-12-25 NOTE — Progress Notes (Signed)
Maternal-Fetal Medicine Consultation: Ms. Kullberg is a 40 year-old [place] at 17 2/7 weeks ([place] of 03/16/16) by first trimester ultrasound performed at [facility] on 07/14/15 with measurements of 8 6/7 weeks who is well-known to our service.   In 2016, she was transferred to [facility] following having had an open appendectomy during pregnancy at [facility].  While at [facility], her wound became infected, she developed non-reassuring fetal testing and underwent an emergent classical cesarean delivery at [redacted] weeks gestation.  She was placed on prophylactic Lovenox following delivery secondary to her history of MI with stent in place.  Her wound was cleaned out by general surgery at the time of her cesarean and since has closed with the help of wound vac therapy.  Her son is in foster care as she had continued problems with cocaine abuse. She has been in outpatient drug treatment for the last 2.5 months and reports being clean.  She states that she has been checking her sugars and they are all less than 120. She is no longer taking any medications for hypertension or her diabetes.    She has no complaints. She had an early first trimester ultrasound at 8 weeks at [facility]. She has not had an anatomy scan since then. She denies vaginal bleeding, abdominal pain, shortness of breath of chest pain. She is taking a daily baby aspirin. She reports good fetal movement.  PMH: Advanced maternal age, Obesity, type II diabetes, chronic hypertension, history of MI s/p stent placement in 2010, history of cocaine abuse, history of chronic pain and opioid dependence (now off opioids)  PSH:  Classical cesarean delivery 2016, open appendectomy 2016, cervical spine fusion, cardiac stent placement  PObH: Emergent classical cesarean delivery at 30 weeks in 2016. Two first trimester uncomplicated SABs without D&C.  PGyn: History of abnormal paps though thinks recent ones were negative.   Meds: PNV, baby  aspirin  Allergies  Allergen Reactions  . Other Shortness Of Breath    Pine nuts and anything pine  . Erythromycin Other (See Comments)    States she is unsure of reaction to it  . Erythromycin Base Rash    Does not remember what happens.  . Latex Itching  . Lorazepam Anxiety    AGGRESSION Other reaction(s): Anxiety AGGRESSION  . Metoclopramide Anxiety    "I want to punch somebody"    Vitals:   12/25/15 1108  BP: 135/86  Pulse: 76  Resp: 18  Temp: 97.7 F (36.5 C)    Assessment and Plan: 40 year-old G4 P0121 at 98 2/7 weeks with multiple issues. [facility] has requested that we transfer her care to [facility].  -Scheduled Transfer of Care appointment to be seen at [facility] baby aspirin and start Prophylactic dosing of lovenox (40 mg daily) given history of MI and stent in place. Report negative APS testing. Script provided. Pt declined teaching as she has used lovenox in the past -Detailed ultrasound scheduled. -Genetic counseling scheduled -Prenatal labs to be done at [facility] at transfer of care appointment -Pt reports checking her sugars at home and that they have been normal. We will check A1C at [facility] and provider her with log books.  Nutrition consultation at [facility]. -Needs repeat cesarean delivery at 37 weeks. Pt knows that she should not labor and to present to L&D with any labor symptoms.  -Clinical Social Work consult to be done at [facility].  Claybon Jabs, [place] A, MD

## 2015-12-25 NOTE — ED Notes (Signed)
Patient education regarding Lovenox given to patient.  Patient states she knows how to administer the medication, side effects, and when to call the provider.  Patient denies needing further education.

## 2016-01-08 DIAGNOSIS — O99413 Diseases of the circulatory system complicating pregnancy, third trimester: Secondary | ICD-10-CM | POA: Diagnosis not present

## 2016-01-08 DIAGNOSIS — Z3A34 34 weeks gestation of pregnancy: Secondary | ICD-10-CM | POA: Diagnosis not present

## 2016-01-08 DIAGNOSIS — O09523 Supervision of elderly multigravida, third trimester: Secondary | ICD-10-CM | POA: Diagnosis not present

## 2016-01-08 DIAGNOSIS — O09513 Supervision of elderly primigravida, third trimester: Secondary | ICD-10-CM | POA: Diagnosis not present

## 2016-01-08 DIAGNOSIS — O10013 Pre-existing essential hypertension complicating pregnancy, third trimester: Secondary | ICD-10-CM | POA: Diagnosis not present

## 2016-01-08 DIAGNOSIS — O24414 Gestational diabetes mellitus in pregnancy, insulin controlled: Secondary | ICD-10-CM | POA: Diagnosis not present

## 2016-01-08 DIAGNOSIS — O24113 Pre-existing diabetes mellitus, type 2, in pregnancy, third trimester: Secondary | ICD-10-CM | POA: Diagnosis not present

## 2016-01-08 DIAGNOSIS — O163 Unspecified maternal hypertension, third trimester: Secondary | ICD-10-CM | POA: Diagnosis not present

## 2016-01-08 DIAGNOSIS — O0993 Supervision of high risk pregnancy, unspecified, third trimester: Secondary | ICD-10-CM | POA: Diagnosis not present

## 2016-01-08 DIAGNOSIS — O10913 Unspecified pre-existing hypertension complicating pregnancy, third trimester: Secondary | ICD-10-CM | POA: Diagnosis not present

## 2016-01-08 DIAGNOSIS — O24913 Unspecified diabetes mellitus in pregnancy, third trimester: Secondary | ICD-10-CM | POA: Diagnosis not present

## 2016-01-08 DIAGNOSIS — Z79899 Other long term (current) drug therapy: Secondary | ICD-10-CM | POA: Diagnosis not present

## 2016-01-08 DIAGNOSIS — Z87898 Personal history of other specified conditions: Secondary | ICD-10-CM | POA: Diagnosis not present

## 2016-01-08 DIAGNOSIS — I213 ST elevation (STEMI) myocardial infarction of unspecified site: Secondary | ICD-10-CM | POA: Diagnosis not present

## 2016-01-08 DIAGNOSIS — E1165 Type 2 diabetes mellitus with hyperglycemia: Secondary | ICD-10-CM | POA: Diagnosis not present

## 2016-01-08 DIAGNOSIS — O4703 False labor before 37 completed weeks of gestation, third trimester: Secondary | ICD-10-CM | POA: Diagnosis not present

## 2016-01-10 ENCOUNTER — Observation Stay
Admission: EM | Admit: 2016-01-10 | Discharge: 2016-01-10 | Disposition: A | Discharge status: Home / Self Care | Payer: Medicare Other | Attending: Obstetrics and Gynecology | Admitting: Obstetrics and Gynecology

## 2016-01-10 ENCOUNTER — Encounter: Payer: Self-pay | Admitting: *Deleted

## 2016-01-10 DIAGNOSIS — O368190 Decreased fetal movements, unspecified trimester, not applicable or unspecified: Principal | ICD-10-CM | POA: Insufficient documentation

## 2016-01-10 DIAGNOSIS — Z3490 Encounter for supervision of normal pregnancy, unspecified, unspecified trimester: Secondary | ICD-10-CM

## 2016-01-10 NOTE — Final Progress Note (Signed)
Patient presents for decreased fetal movement.  She has a complicated medical history and very high risk pregnancy for which she receives care at [facility].   She notes no vaginal bleeding, no leakage of fluid, and no contractions.  NST: Baseline: 140 bpm Variability: moderate Accelerations: present Decelerations: absent Tocometry: no contractions  Patient discharged, with instructions to keep follow up at [facility] and to present to [place] in the future given her very high risk and our limited ability and resources to provide adequate care for her and her pregnancy.  Prentice Docker, MD 01/10/2016 4:05 AM

## 2016-01-10 NOTE — OB Triage Note (Signed)
Recvd from ED. C/O not feeling baby move since 10:30 Pm last night. Pt has not other complaints and was hooked up to the EFM.

## 2016-01-10 NOTE — Discharge Instructions (Signed)
Come back if:  Big gush of fluids Temp over 100.4 Decreased fetal movement Heavy vaginal bleeding Contractions every 3-5 min  Get plenty of rest and stay well hydrated!

## 2016-01-11 ENCOUNTER — Observation Stay
Admission: EM | Admit: 2016-01-11 | Discharge: 2016-01-11 | Disposition: A | Discharge status: Home / Self Care | Payer: Medicare Other | Attending: Obstetrics and Gynecology | Admitting: Obstetrics and Gynecology

## 2016-01-11 DIAGNOSIS — Z3A34 34 weeks gestation of pregnancy: Secondary | ICD-10-CM | POA: Diagnosis not present

## 2016-01-11 DIAGNOSIS — O24913 Unspecified diabetes mellitus in pregnancy, third trimester: Secondary | ICD-10-CM | POA: Diagnosis not present

## 2016-01-11 DIAGNOSIS — I252 Old myocardial infarction: Secondary | ICD-10-CM | POA: Diagnosis not present

## 2016-01-11 DIAGNOSIS — Z87898 Personal history of other specified conditions: Secondary | ICD-10-CM | POA: Diagnosis not present

## 2016-01-11 DIAGNOSIS — O368130 Decreased fetal movements, third trimester, not applicable or unspecified: Secondary | ICD-10-CM | POA: Diagnosis not present

## 2016-01-11 DIAGNOSIS — O163 Unspecified maternal hypertension, third trimester: Secondary | ICD-10-CM | POA: Insufficient documentation

## 2016-01-11 DIAGNOSIS — O4290 Premature rupture of membranes, unspecified as to length of time between rupture and onset of labor, unspecified weeks of gestation: Secondary | ICD-10-CM | POA: Diagnosis present

## 2016-01-11 DIAGNOSIS — O9989 Other specified diseases and conditions complicating pregnancy, childbirth and the puerperium: Secondary | ICD-10-CM | POA: Diagnosis not present

## 2016-01-11 NOTE — Final Progress Note (Signed)
Physician Final Progress Note  Patient ID: ELLIYAH LISZEWSKI MRN: [id number] DOB/AGE: 1976-03-02 40 y.o.  Admit date: 01/11/2016 Admitting provider: Malachy Mood, MD Discharge date: 01/11/2016   Admission Diagnoses: Leaking fluid, decreased fetal movement. lumbago  Discharge Diagnoses:  No evidence of ruptured membranes, non-reactive NST with BPP 8/8  Patient is a 40 yo G8P0251 at 82w1dpresenting with above complaints.  PMH notable for classic C-section, DMII, CHTN, cocaine abuse, and MI with stent placement.  On presentation negative work up for PPROM with negative nitrazine, ferning, and pooling.  Cervix closed, no contractions on tocometer.  Bedside BPP vertex:,  2 AFI 16.6cm 2 Gross movement 2 Flexion and extension 2 Spontaneous fetal breathing  for BPP 8/8  Discussed chronic back pain with history of spinal fusion something to be addressed by her routine prenatal provider.  She does not have CVA or dysuria  Consults: None  Significant Findings/ Diagnostic Studies: none  Procedures: NST 140, moderate variability, no accels, no decels  Discharge Condition: good  Disposition: 01-Home or Self Care  Diet: Regular diet  Discharge Activity: Activity as tolerated  Discharge Instructions    Discharge activity:  No Restrictions    Complete by:  As directed   Fetal Kick Count:  Lie on our left side for one hour after a meal, and count the number of times your baby kicks.  If it is less than 5 times, get up, move around and drink some juice.  Repeat the test 30 minutes later.  If it is still less than 5 kicks in an hour, notify your doctor.    Complete by:  As directed   Notify physician for a general feeling that "something is not right"    Complete by:  As directed   Notify physician for increase or change in vaginal discharge    Complete by:  As directed   Notify physician for intestinal cramps, with or without diarrhea, sometimes described as "gas pain"    Complete by:  As  directed   Notify physician for leaking of fluid    Complete by:  As directed   Notify physician for low, dull backache, unrelieved by heat or Tylenol    Complete by:  As directed   Notify physician for menstrual like cramps    Complete by:  As directed   Notify physician for pelvic pressure    Complete by:  As directed   Notify physician for uterine contractions.  These may be painless and feel like the uterus is tightening or the baby is  "balling up"    Complete by:  As directed   Notify physician for vaginal bleeding    Complete by:  As directed   PRETERM LABOR:  Includes any of the follwing symptoms that occur between 20 - [redacted] weeks gestation.  If these symptoms are not stopped, preterm labor can result in preterm delivery, placing your baby at risk    Complete by:  As directed       Medication List    TAKE these medications   aspirin 81 MG tablet Take 81 mg by mouth daily.   enoxaparin 40 MG/0.4ML injection Commonly known as:  LOVENOX Inject 0.4 mLs (40 mg total) into the skin daily.   FIFTY50 GLUCOSE METER 2.0 w/Device Kit   prenatal multivitamin Tabs tablet Take 1 tablet by mouth daily at 12 noon.        Total time spent taking care of this patient: 45 minutes  Signed: Krystle Oberman  M 01/11/2016, 6:44 PM

## 2016-01-11 NOTE — OB Triage Note (Signed)
Back pain , leaking fluid. Nichole Wallace

## 2016-01-12 DIAGNOSIS — O09523 Supervision of elderly multigravida, third trimester: Secondary | ICD-10-CM | POA: Diagnosis not present

## 2016-01-12 DIAGNOSIS — I252 Old myocardial infarction: Secondary | ICD-10-CM | POA: Diagnosis not present

## 2016-01-12 DIAGNOSIS — O99413 Diseases of the circulatory system complicating pregnancy, third trimester: Secondary | ICD-10-CM | POA: Diagnosis not present

## 2016-01-12 DIAGNOSIS — Z3A34 34 weeks gestation of pregnancy: Secondary | ICD-10-CM | POA: Diagnosis not present

## 2016-01-12 DIAGNOSIS — Z87898 Personal history of other specified conditions: Secondary | ICD-10-CM | POA: Diagnosis not present

## 2016-01-12 DIAGNOSIS — I519 Heart disease, unspecified: Secondary | ICD-10-CM | POA: Diagnosis not present

## 2016-01-12 DIAGNOSIS — O24113 Pre-existing diabetes mellitus, type 2, in pregnancy, third trimester: Secondary | ICD-10-CM | POA: Diagnosis not present

## 2016-01-12 DIAGNOSIS — O0993 Supervision of high risk pregnancy, unspecified, third trimester: Secondary | ICD-10-CM | POA: Diagnosis not present

## 2016-01-20 DIAGNOSIS — O24414 Gestational diabetes mellitus in pregnancy, insulin controlled: Secondary | ICD-10-CM | POA: Diagnosis not present

## 2016-01-20 DIAGNOSIS — Z23 Encounter for immunization: Secondary | ICD-10-CM | POA: Diagnosis not present

## 2016-01-20 DIAGNOSIS — Z87898 Personal history of other specified conditions: Secondary | ICD-10-CM | POA: Diagnosis not present

## 2016-01-20 DIAGNOSIS — G894 Chronic pain syndrome: Secondary | ICD-10-CM | POA: Diagnosis not present

## 2016-01-20 DIAGNOSIS — Z3A36 36 weeks gestation of pregnancy: Secondary | ICD-10-CM | POA: Diagnosis not present

## 2016-01-20 DIAGNOSIS — O34219 Maternal care for unspecified type scar from previous cesarean delivery: Secondary | ICD-10-CM | POA: Diagnosis not present

## 2016-01-20 DIAGNOSIS — I213 ST elevation (STEMI) myocardial infarction of unspecified site: Secondary | ICD-10-CM | POA: Diagnosis not present

## 2016-01-20 DIAGNOSIS — O10013 Pre-existing essential hypertension complicating pregnancy, third trimester: Secondary | ICD-10-CM | POA: Diagnosis not present

## 2016-01-20 DIAGNOSIS — O365930 Maternal care for other known or suspected poor fetal growth, third trimester, not applicable or unspecified: Secondary | ICD-10-CM | POA: Diagnosis not present

## 2016-01-20 DIAGNOSIS — O0993 Supervision of high risk pregnancy, unspecified, third trimester: Secondary | ICD-10-CM | POA: Diagnosis not present

## 2016-01-23 DIAGNOSIS — O24414 Gestational diabetes mellitus in pregnancy, insulin controlled: Secondary | ICD-10-CM | POA: Diagnosis not present

## 2016-01-23 DIAGNOSIS — G8929 Other chronic pain: Secondary | ICD-10-CM | POA: Diagnosis present

## 2016-01-23 DIAGNOSIS — E669 Obesity, unspecified: Secondary | ICD-10-CM | POA: Diagnosis present

## 2016-01-23 DIAGNOSIS — I252 Old myocardial infarction: Secondary | ICD-10-CM | POA: Diagnosis not present

## 2016-01-23 DIAGNOSIS — O9882 Other maternal infectious and parasitic diseases complicating childbirth: Secondary | ICD-10-CM | POA: Diagnosis present

## 2016-01-23 DIAGNOSIS — O34212 Maternal care for vertical scar from previous cesarean delivery: Secondary | ICD-10-CM | POA: Diagnosis not present

## 2016-01-23 DIAGNOSIS — O365930 Maternal care for other known or suspected poor fetal growth, third trimester, not applicable or unspecified: Secondary | ICD-10-CM | POA: Diagnosis not present

## 2016-01-23 DIAGNOSIS — B372 Candidiasis of skin and nail: Secondary | ICD-10-CM | POA: Diagnosis present

## 2016-01-23 DIAGNOSIS — O99334 Smoking (tobacco) complicating childbirth: Secondary | ICD-10-CM | POA: Diagnosis not present

## 2016-01-23 DIAGNOSIS — N858 Other specified noninflammatory disorders of uterus: Secondary | ICD-10-CM | POA: Diagnosis present

## 2016-01-23 DIAGNOSIS — O1002 Pre-existing essential hypertension complicating childbirth: Secondary | ICD-10-CM | POA: Diagnosis not present

## 2016-01-23 DIAGNOSIS — O164 Unspecified maternal hypertension, complicating childbirth: Secondary | ICD-10-CM | POA: Diagnosis present

## 2016-01-23 DIAGNOSIS — O99214 Obesity complicating childbirth: Secondary | ICD-10-CM | POA: Diagnosis present

## 2016-01-23 DIAGNOSIS — Z91040 Latex allergy status: Secondary | ICD-10-CM | POA: Diagnosis not present

## 2016-01-23 DIAGNOSIS — Z6833 Body mass index (BMI) 33.0-33.9, adult: Secondary | ICD-10-CM | POA: Diagnosis not present

## 2016-01-23 DIAGNOSIS — Z9861 Coronary angioplasty status: Secondary | ICD-10-CM | POA: Diagnosis not present

## 2016-01-23 DIAGNOSIS — O99344 Other mental disorders complicating childbirth: Secondary | ICD-10-CM | POA: Diagnosis not present

## 2016-01-23 DIAGNOSIS — O2412 Pre-existing diabetes mellitus, type 2, in childbirth: Secondary | ICD-10-CM | POA: Diagnosis not present

## 2016-01-23 DIAGNOSIS — F17210 Nicotine dependence, cigarettes, uncomplicated: Secondary | ICD-10-CM | POA: Diagnosis present

## 2016-01-23 DIAGNOSIS — K660 Peritoneal adhesions (postprocedural) (postinfection): Secondary | ICD-10-CM | POA: Diagnosis present

## 2016-01-23 DIAGNOSIS — O9962 Diseases of the digestive system complicating childbirth: Secondary | ICD-10-CM | POA: Diagnosis not present

## 2016-01-23 DIAGNOSIS — E119 Type 2 diabetes mellitus without complications: Secondary | ICD-10-CM | POA: Diagnosis present

## 2016-01-23 DIAGNOSIS — Z794 Long term (current) use of insulin: Secondary | ICD-10-CM | POA: Diagnosis not present

## 2016-01-23 DIAGNOSIS — O0993 Supervision of high risk pregnancy, unspecified, third trimester: Secondary | ICD-10-CM | POA: Diagnosis not present

## 2016-01-23 DIAGNOSIS — Z9049 Acquired absence of other specified parts of digestive tract: Secondary | ICD-10-CM | POA: Diagnosis not present

## 2016-01-23 DIAGNOSIS — O34211 Maternal care for low transverse scar from previous cesarean delivery: Secondary | ICD-10-CM | POA: Diagnosis present

## 2016-01-23 DIAGNOSIS — K429 Umbilical hernia without obstruction or gangrene: Secondary | ICD-10-CM | POA: Diagnosis present

## 2016-01-23 DIAGNOSIS — O09523 Supervision of elderly multigravida, third trimester: Secondary | ICD-10-CM | POA: Diagnosis not present

## 2016-01-23 DIAGNOSIS — O10013 Pre-existing essential hypertension complicating pregnancy, third trimester: Secondary | ICD-10-CM | POA: Diagnosis not present

## 2016-01-23 DIAGNOSIS — Z3A36 36 weeks gestation of pregnancy: Secondary | ICD-10-CM | POA: Diagnosis not present

## 2016-01-23 DIAGNOSIS — O9989 Other specified diseases and conditions complicating pregnancy, childbirth and the puerperium: Secondary | ICD-10-CM | POA: Diagnosis not present

## 2016-01-30 DIAGNOSIS — M7989 Other specified soft tissue disorders: Secondary | ICD-10-CM | POA: Diagnosis not present

## 2016-10-06 ENCOUNTER — Encounter (HOSPITAL_COMMUNITY): Payer: Self-pay

## 2016-12-05 DIAGNOSIS — O99340 Other mental disorders complicating pregnancy, unspecified trimester: Secondary | ICD-10-CM | POA: Diagnosis not present

## 2016-12-05 DIAGNOSIS — O2340 Unspecified infection of urinary tract in pregnancy, unspecified trimester: Secondary | ICD-10-CM | POA: Diagnosis not present

## 2016-12-05 DIAGNOSIS — F419 Anxiety disorder, unspecified: Secondary | ICD-10-CM | POA: Diagnosis not present

## 2016-12-05 DIAGNOSIS — O009 Unspecified ectopic pregnancy without intrauterine pregnancy: Secondary | ICD-10-CM | POA: Diagnosis not present

## 2016-12-05 DIAGNOSIS — O0091 Unspecified ectopic pregnancy with intrauterine pregnancy: Secondary | ICD-10-CM | POA: Diagnosis not present

## 2016-12-05 DIAGNOSIS — Z3A00 Weeks of gestation of pregnancy not specified: Secondary | ICD-10-CM | POA: Diagnosis not present

## 2016-12-05 DIAGNOSIS — O99330 Smoking (tobacco) complicating pregnancy, unspecified trimester: Secondary | ICD-10-CM | POA: Diagnosis not present

## 2016-12-05 DIAGNOSIS — R319 Hematuria, unspecified: Secondary | ICD-10-CM | POA: Diagnosis not present

## 2016-12-05 DIAGNOSIS — Z9049 Acquired absence of other specified parts of digestive tract: Secondary | ICD-10-CM | POA: Diagnosis not present

## 2016-12-05 DIAGNOSIS — Z959 Presence of cardiac and vascular implant and graft, unspecified: Secondary | ICD-10-CM | POA: Diagnosis not present

## 2016-12-05 DIAGNOSIS — F329 Major depressive disorder, single episode, unspecified: Secondary | ICD-10-CM | POA: Diagnosis not present

## 2016-12-05 DIAGNOSIS — O24119 Pre-existing diabetes mellitus, type 2, in pregnancy, unspecified trimester: Secondary | ICD-10-CM | POA: Diagnosis not present

## 2016-12-05 DIAGNOSIS — F17210 Nicotine dependence, cigarettes, uncomplicated: Secondary | ICD-10-CM | POA: Diagnosis not present

## 2016-12-05 DIAGNOSIS — O26899 Other specified pregnancy related conditions, unspecified trimester: Secondary | ICD-10-CM | POA: Diagnosis not present

## 2016-12-05 DIAGNOSIS — I2510 Atherosclerotic heart disease of native coronary artery without angina pectoris: Secondary | ICD-10-CM | POA: Diagnosis not present

## 2016-12-05 DIAGNOSIS — Z981 Arthrodesis status: Secondary | ICD-10-CM | POA: Diagnosis not present

## 2016-12-05 DIAGNOSIS — R109 Unspecified abdominal pain: Secondary | ICD-10-CM | POA: Diagnosis not present

## 2016-12-05 DIAGNOSIS — E119 Type 2 diabetes mellitus without complications: Secondary | ICD-10-CM | POA: Diagnosis not present

## 2016-12-05 DIAGNOSIS — O209 Hemorrhage in early pregnancy, unspecified: Secondary | ICD-10-CM | POA: Diagnosis not present

## 2016-12-05 DIAGNOSIS — O99419 Diseases of the circulatory system complicating pregnancy, unspecified trimester: Secondary | ICD-10-CM | POA: Diagnosis not present

## 2016-12-09 DIAGNOSIS — Z3490 Encounter for supervision of normal pregnancy, unspecified, unspecified trimester: Secondary | ICD-10-CM | POA: Diagnosis not present

## 2016-12-09 DIAGNOSIS — O2340 Unspecified infection of urinary tract in pregnancy, unspecified trimester: Secondary | ICD-10-CM | POA: Diagnosis not present

## 2016-12-09 DIAGNOSIS — O09529 Supervision of elderly multigravida, unspecified trimester: Secondary | ICD-10-CM | POA: Diagnosis not present

## 2016-12-10 DIAGNOSIS — E119 Type 2 diabetes mellitus without complications: Secondary | ICD-10-CM | POA: Diagnosis not present

## 2016-12-10 DIAGNOSIS — I10 Essential (primary) hypertension: Secondary | ICD-10-CM | POA: Diagnosis not present

## 2016-12-10 DIAGNOSIS — I252 Old myocardial infarction: Secondary | ICD-10-CM | POA: Diagnosis not present

## 2016-12-10 DIAGNOSIS — O0941 Supervision of pregnancy with grand multiparity, first trimester: Secondary | ICD-10-CM | POA: Diagnosis not present

## 2016-12-10 DIAGNOSIS — R1909 Other intra-abdominal and pelvic swelling, mass and lump: Secondary | ICD-10-CM | POA: Diagnosis not present

## 2016-12-10 DIAGNOSIS — O09521 Supervision of elderly multigravida, first trimester: Secondary | ICD-10-CM | POA: Diagnosis not present

## 2016-12-10 DIAGNOSIS — F419 Anxiety disorder, unspecified: Secondary | ICD-10-CM | POA: Diagnosis not present

## 2016-12-10 DIAGNOSIS — Z955 Presence of coronary angioplasty implant and graft: Secondary | ICD-10-CM | POA: Diagnosis not present

## 2016-12-10 DIAGNOSIS — O26891 Other specified pregnancy related conditions, first trimester: Secondary | ICD-10-CM | POA: Diagnosis not present

## 2016-12-10 DIAGNOSIS — F329 Major depressive disorder, single episode, unspecified: Secondary | ICD-10-CM | POA: Diagnosis not present

## 2016-12-10 DIAGNOSIS — F17210 Nicotine dependence, cigarettes, uncomplicated: Secondary | ICD-10-CM | POA: Diagnosis not present

## 2016-12-10 DIAGNOSIS — O021 Missed abortion: Secondary | ICD-10-CM | POA: Diagnosis not present

## 2016-12-10 DIAGNOSIS — O3680X0 Pregnancy with inconclusive fetal viability, not applicable or unspecified: Secondary | ICD-10-CM | POA: Diagnosis not present

## 2016-12-10 DIAGNOSIS — Z3A01 Less than 8 weeks gestation of pregnancy: Secondary | ICD-10-CM | POA: Diagnosis not present

## 2016-12-10 DIAGNOSIS — I2510 Atherosclerotic heart disease of native coronary artery without angina pectoris: Secondary | ICD-10-CM | POA: Diagnosis not present

## 2016-12-17 DIAGNOSIS — O3680X0 Pregnancy with inconclusive fetal viability, not applicable or unspecified: Secondary | ICD-10-CM | POA: Diagnosis not present

## 2016-12-22 DIAGNOSIS — Z3490 Encounter for supervision of normal pregnancy, unspecified, unspecified trimester: Secondary | ICD-10-CM | POA: Diagnosis not present

## 2016-12-27 DIAGNOSIS — I2510 Atherosclerotic heart disease of native coronary artery without angina pectoris: Secondary | ICD-10-CM | POA: Diagnosis not present

## 2016-12-27 DIAGNOSIS — Z955 Presence of coronary angioplasty implant and graft: Secondary | ICD-10-CM | POA: Diagnosis not present

## 2016-12-27 DIAGNOSIS — F329 Major depressive disorder, single episode, unspecified: Secondary | ICD-10-CM | POA: Diagnosis not present

## 2016-12-27 DIAGNOSIS — N390 Urinary tract infection, site not specified: Secondary | ICD-10-CM | POA: Diagnosis not present

## 2016-12-27 DIAGNOSIS — I252 Old myocardial infarction: Secondary | ICD-10-CM | POA: Diagnosis not present

## 2016-12-27 DIAGNOSIS — E119 Type 2 diabetes mellitus without complications: Secondary | ICD-10-CM | POA: Diagnosis not present

## 2016-12-27 DIAGNOSIS — I10 Essential (primary) hypertension: Secondary | ICD-10-CM | POA: Diagnosis not present

## 2016-12-27 DIAGNOSIS — R938 Abnormal findings on diagnostic imaging of other specified body structures: Secondary | ICD-10-CM | POA: Diagnosis not present

## 2016-12-27 DIAGNOSIS — R1030 Lower abdominal pain, unspecified: Secondary | ICD-10-CM | POA: Diagnosis not present

## 2016-12-27 DIAGNOSIS — F17210 Nicotine dependence, cigarettes, uncomplicated: Secondary | ICD-10-CM | POA: Diagnosis not present

## 2016-12-27 DIAGNOSIS — F419 Anxiety disorder, unspecified: Secondary | ICD-10-CM | POA: Diagnosis not present

## 2017-01-21 DIAGNOSIS — H1045 Other chronic allergic conjunctivitis: Secondary | ICD-10-CM | POA: Diagnosis not present

## 2017-01-21 DIAGNOSIS — H02052 Trichiasis without entropian right lower eyelid: Secondary | ICD-10-CM | POA: Diagnosis not present

## 2017-03-24 ENCOUNTER — Emergency Department
Admission: EM | Admit: 2017-03-24 | Discharge: 2017-03-25 | Disposition: A | Discharge status: Home / Self Care | Payer: Medicare Other | Attending: Emergency Medicine | Admitting: Emergency Medicine

## 2017-03-24 ENCOUNTER — Emergency Department: Payer: Medicare Other

## 2017-03-24 DIAGNOSIS — Z91040 Latex allergy status: Secondary | ICD-10-CM | POA: Insufficient documentation

## 2017-03-24 DIAGNOSIS — I2510 Atherosclerotic heart disease of native coronary artery without angina pectoris: Secondary | ICD-10-CM | POA: Insufficient documentation

## 2017-03-24 DIAGNOSIS — R0981 Nasal congestion: Secondary | ICD-10-CM | POA: Insufficient documentation

## 2017-03-24 DIAGNOSIS — J40 Bronchitis, not specified as acute or chronic: Secondary | ICD-10-CM

## 2017-03-24 DIAGNOSIS — R062 Wheezing: Secondary | ICD-10-CM | POA: Diagnosis not present

## 2017-03-24 DIAGNOSIS — Z79899 Other long term (current) drug therapy: Secondary | ICD-10-CM | POA: Insufficient documentation

## 2017-03-24 DIAGNOSIS — R059 Cough, unspecified: Secondary | ICD-10-CM

## 2017-03-24 DIAGNOSIS — E119 Type 2 diabetes mellitus without complications: Secondary | ICD-10-CM | POA: Diagnosis not present

## 2017-03-24 DIAGNOSIS — F17210 Nicotine dependence, cigarettes, uncomplicated: Secondary | ICD-10-CM | POA: Insufficient documentation

## 2017-03-24 DIAGNOSIS — Z7982 Long term (current) use of aspirin: Secondary | ICD-10-CM | POA: Diagnosis not present

## 2017-03-24 DIAGNOSIS — R05 Cough: Secondary | ICD-10-CM | POA: Diagnosis not present

## 2017-03-24 DIAGNOSIS — G8929 Other chronic pain: Secondary | ICD-10-CM | POA: Insufficient documentation

## 2017-03-24 LAB — COMPREHENSIVE METABOLIC PANEL
ALK PHOS: 47 U/L (ref 38–126)
ALT: 10 U/L — ABNORMAL LOW (ref 14–54)
ANION GAP: 9 (ref 5–15)
AST: 20 U/L (ref 15–41)
Albumin: 3.6 g/dL (ref 3.5–5.0)
BILIRUBIN TOTAL: 0.4 mg/dL (ref 0.3–1.2)
BUN: 8 mg/dL (ref 6–20)
CALCIUM: 8.8 mg/dL — AB (ref 8.9–10.3)
CO2: 26 mmol/L (ref 22–32)
Chloride: 104 mmol/L (ref 101–111)
Creatinine, Ser: 0.8 mg/dL (ref 0.44–1.00)
Glucose, Bld: 153 mg/dL — ABNORMAL HIGH (ref 65–99)
Potassium: 3.7 mmol/L (ref 3.5–5.1)
Sodium: 139 mmol/L (ref 135–145)
TOTAL PROTEIN: 6.8 g/dL (ref 6.5–8.1)

## 2017-03-24 LAB — URINALYSIS, COMPLETE (UACMP) WITH MICROSCOPIC
BACTERIA UA: NONE SEEN
BILIRUBIN URINE: NEGATIVE
Glucose, UA: NEGATIVE mg/dL
HGB URINE DIPSTICK: NEGATIVE
Ketones, ur: NEGATIVE mg/dL
NITRITE: NEGATIVE
PH: 6 (ref 5.0–8.0)
Protein, ur: NEGATIVE mg/dL
SPECIFIC GRAVITY, URINE: 1.015 (ref 1.005–1.030)

## 2017-03-24 LAB — CBC WITH DIFFERENTIAL/PLATELET
BASOS ABS: 0 10*3/uL (ref 0–0.1)
BASOS PCT: 0 %
Eosinophils Absolute: 0.2 10*3/uL (ref 0–0.7)
Eosinophils Relative: 2 %
HEMATOCRIT: 37.6 % (ref 35.0–47.0)
HEMOGLOBIN: 12.5 g/dL (ref 12.0–16.0)
Lymphocytes Relative: 30 %
Lymphs Abs: 3.7 10*3/uL — ABNORMAL HIGH (ref 1.0–3.6)
MCH: 30.7 pg (ref 26.0–34.0)
MCHC: 33.2 g/dL (ref 32.0–36.0)
MCV: 92.3 fL (ref 80.0–100.0)
Monocytes Absolute: 0.7 10*3/uL (ref 0.2–0.9)
Monocytes Relative: 5 %
NEUTROS ABS: 7.9 10*3/uL — AB (ref 1.4–6.5)
NEUTROS PCT: 63 %
Platelets: 230 10*3/uL (ref 150–440)
RBC: 4.07 MIL/uL (ref 3.80–5.20)
RDW: 12.9 % (ref 11.5–14.5)
WBC: 12.5 10*3/uL — ABNORMAL HIGH (ref 3.6–11.0)

## 2017-03-24 MED ORDER — IPRATROPIUM-ALBUTEROL 0.5-2.5 (3) MG/3ML IN SOLN
3.0000 mL | Freq: Once | RESPIRATORY_TRACT | Status: AC
  Administered 2017-03-24: 3 mL via RESPIRATORY_TRACT
  Filled 2017-03-24: qty 3

## 2017-03-24 MED ORDER — METHYLPREDNISOLONE SODIUM SUCC 125 MG IJ SOLR
80.0000 mg | Freq: Once | INTRAMUSCULAR | Status: AC
  Administered 2017-03-24: 80 mg via INTRAVENOUS
  Filled 2017-03-24: qty 2

## 2017-03-24 MED ORDER — DEXTROSE 5 % IV SOLN
1.0000 g | Freq: Once | INTRAVENOUS | Status: AC
  Administered 2017-03-24: 1 g via INTRAVENOUS
  Filled 2017-03-24: qty 10
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
## 2017-03-24 NOTE — ED Notes (Signed)
Pt reports she and her husband live at the shelter and everyone at the shelter has had upper resp infections recently. PT reports she has had her symptoms for the past two weeks. Pt denies fever, NVD.   Pt has persistant cough with yellow sputum. Chest pain and rib pain with drainage and congestion and headache. No neuro deficits noted.

## 2017-03-24 NOTE — ED Notes (Signed)
PA Aquilla Solian reported she would see patient in flex, and to order chest Xray.

## 2017-03-24 NOTE — ED Triage Notes (Signed)
Patient c/o productive cough with thick yellow/green sputum X 2 weeks. Patient has wheezes bilaterally, headache and chest wall pain.

## 2017-03-25 MED ORDER — AMOXICILLIN 500 MG PO TABS: 500.0000 mg | ORAL_TABLET | Freq: Three times a day (TID) | ORAL | 0 refills | Status: AC

## 2017-03-25 MED ORDER — PREDNISONE 10 MG (21) PO TBPK: ORAL_TABLET | ORAL | 0 refills | Status: AC

## 2017-03-25 MED ORDER — GUAIFENESIN-CODEINE 100-10 MG/5ML PO SOLN: 10.0000 mL | Freq: Four times a day (QID) | ORAL | 0 refills | Status: AC | PRN

## 2017-03-25 NOTE — ED Notes (Signed)
Pt given taxi voucher

## 2017-03-25 NOTE — Discharge Instructions (Signed)
Take medication as prescribed. Return to emergency department if symptoms worsen and follow-up with PCP as needed.   °

## 2017-03-27 ENCOUNTER — Encounter: Payer: Self-pay | Admitting: Emergency Medicine

## 2017-03-27 ENCOUNTER — Emergency Department
Admission: EM | Admit: 2017-03-27 | Discharge: 2017-03-27 | Disposition: A | Discharge status: Home / Self Care | Payer: Medicare Other | Attending: Emergency Medicine | Admitting: Emergency Medicine

## 2017-03-27 DIAGNOSIS — Z91040 Latex allergy status: Secondary | ICD-10-CM | POA: Insufficient documentation

## 2017-03-27 DIAGNOSIS — Z7982 Long term (current) use of aspirin: Secondary | ICD-10-CM | POA: Insufficient documentation

## 2017-03-27 DIAGNOSIS — I2510 Atherosclerotic heart disease of native coronary artery without angina pectoris: Secondary | ICD-10-CM | POA: Diagnosis not present

## 2017-03-27 DIAGNOSIS — Z79899 Other long term (current) drug therapy: Secondary | ICD-10-CM | POA: Insufficient documentation

## 2017-03-27 DIAGNOSIS — E119 Type 2 diabetes mellitus without complications: Secondary | ICD-10-CM | POA: Insufficient documentation

## 2017-03-27 DIAGNOSIS — I252 Old myocardial infarction: Secondary | ICD-10-CM | POA: Insufficient documentation

## 2017-03-27 DIAGNOSIS — O1002 Pre-existing essential hypertension complicating childbirth: Secondary | ICD-10-CM | POA: Insufficient documentation

## 2017-03-27 DIAGNOSIS — R05 Cough: Secondary | ICD-10-CM | POA: Diagnosis present

## 2017-03-27 DIAGNOSIS — J189 Pneumonia, unspecified organism: Secondary | ICD-10-CM | POA: Insufficient documentation

## 2017-03-27 DIAGNOSIS — F17210 Nicotine dependence, cigarettes, uncomplicated: Secondary | ICD-10-CM | POA: Insufficient documentation

## 2017-03-27 MED ORDER — AZITHROMYCIN 250 MG PO TABS: ORAL_TABLET | ORAL | 0 refills | Status: AC

## 2017-03-27 MED ORDER — DEXTROSE 5 % IV SOLN
1.0000 g | Freq: Once | INTRAVENOUS | Status: AC
  Administered 2017-03-27: 1 g via INTRAVENOUS
  Filled 2017-03-27: qty 10

## 2017-03-27 MED ORDER — IPRATROPIUM-ALBUTEROL 0.5-2.5 (3) MG/3ML IN SOLN
3.0000 mL | Freq: Once | RESPIRATORY_TRACT | Status: AC
  Administered 2017-03-27: 3 mL via RESPIRATORY_TRACT
  Filled 2017-03-27: qty 3

## 2017-03-27 MED ORDER — METHYLPREDNISOLONE SODIUM SUCC 125 MG IJ SOLR
125.0000 mg | Freq: Once | INTRAMUSCULAR | Status: AC
  Administered 2017-03-27: 125 mg via INTRAVENOUS
  Filled 2017-03-27: qty 2

## 2017-03-27 NOTE — ED Triage Notes (Signed)
Patient presents to the ED with cough x 2 weeks, congestion and nasal drainage x 1 week.  Patient states she was started on antibiotics and steroids on Thursday but that she is not feeling better at this time.  Patient states she isn't coughing anything up and she feels like, "I might have pulled a muscle from coughing."  Patient denies vomiting and diarrhea.

## 2017-03-27 NOTE — ED Provider Notes (Signed)
21-month-old normal history [facility] Emergency Department Provider Note  ____________________________________________  Time seen: Approximately 8:36 PM  I have reviewed the triage vital signs and the nursing notes.   HISTORY  Chief Complaint Nasal Congestion and Cough Is not low blood   HPI Nichole Wallace is a 41 y.o. female presents to the emergency department with persistent cough for the past 2 weeks.  Patient reports that she was placed on amoxicillin when she was seen on 03/24/2017.  Patient has been compliant with medications other than cough suppressant as patient stated that Medicaid would not cover cost.  Patient reports mild improvement with prednisone.  Patient reports that shortness of breath and fatigue have persisted.  She denies chest pain, chest tightness, nausea, vomiting or abdominal pain.   Past Medical History:  Diagnosis Date  . Anxiety   . Appendicitis   . CAD in native artery 10/02/2014  . Cervical spinal cord compression ([place]) 07/04/2013  . Chronic pain 11/28/2013  . Cocaine abuse ([place])   . Depression   . Diabetes ([place]) 04/09/2013  . Diabetes mellitus without complication ([place])   . Heart attack ([place]) 04/09/2013   Overview:  History per patient   . Hypertension   . Lumbar disc disease with radiculopathy 11/30/2013  . Myocardial infarction ([place])   . Preterm labor    6 losses early on  . Ruptured appendix   . S/P coronary artery balloon dilation 04/09/2013   Overview:  Overview:  History per patient     Patient Active Problem List   Diagnosis Date Noted  . Amniotic fluid leaking 01/11/2016  . Pregnancy 01/10/2016  . Positive urine drug screen 12/09/2015  . Depression 12/03/2015  . History of cesarean section 12/03/2015  . Late prenatal care 12/03/2015  . Advanced maternal age in multigravida 12/03/2015  . History of substance use 12/03/2015  . Tobacco user 12/03/2015  . History of more than 3 abortions 12/03/2015  .  History of MI (myocardial infarction) 12/03/2015  . History of chronic hypertension 12/03/2015  . DM (diabetes mellitus screen) 12/03/2015  . Ruptured appendix   . Antepartum diabetes mellitus 12/17/2014  . Maternal tobacco use 10/22/2014  . 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 Anxiety and depression 10/22/2014  . Abnormal thyroid stimulating hormone (TSH) level 10/11/2014  . Presence of stent in right coronary artery 10/02/2014  . CAD in native artery 10/02/2014  . Supervision of elderly primigravida 09/04/2014  . Chronic pain due to injury 09/04/2014  . Chronic hypertension in pregnancy 09/04/2014  . Benign essential hypertension in obstetric context 09/04/2014  . H/O acute myocardial infarction 08/29/2014  . 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 Lumbar disc disease with radiculopathy 11/30/2013  . Cervical disc disease 11/28/2013  . H/O arthrodesis 07/04/2013  . Cervical spinal cord compression ([place]) 07/04/2013  . Cervical myelopathy ([place]) 07/04/2013  . Cervical spinal stenosis 06/15/2013  . Compulsive tobacco user syndrome 06/14/2013  . Current tobacco use 06/14/2013  . Diabetes mellitus, type 2 ([place]) 04/09/2013  . S/P coronary artery balloon dilation 04/09/2013  . Heart attack ([place]) 04/09/2013  . Presence of stent in coronary artery 04/09/2013  . Diabetes ([place]) 04/09/2013  . Spondylopathy 04/09/2013  . Acute MI ([place]) 04/09/2013    Past Surgical History:  Procedure Laterality Date  . CESAREAN SECTION  01/26/2015   [facility]  . CORONARY ANGIOPLASTY WITH STENT PLACEMENT    . DILATION AND CURETTAGE OF UTERUS    . GALLBLADDER SURGERY    .  neck fusion    . TONSILLECTOMY      Prior to Admission medications   Medication Sig Start Date End Date Taking? Authorizing Provider  amoxicillin (AMOXIL) 500 MG tablet Take 1 tablet (500 mg total) by mouth 3 (three) times daily. 03/25/17 04/04/17  Little, Traci M, PA-C  aspirin 81 MG tablet Take 81 mg by mouth daily.     [provider]  azithromycin (ZITHROMAX Z-PAK) 250 MG tablet Take 2 tablets (500 mg) on  Day 1,  followed by 1 tablet (250 mg) once daily on Days 2 through 5. 03/27/17 04/01/17  Lannie Fields, PA-C  enoxaparin (LOVENOX) 40 MG/0.4ML injection Inject 0.4 mLs (40 mg total) into the skin daily. Patient not taking: Reported on 01/10/2016 12/25/15 05/23/16  Grotegut, [place], MD  guaiFENesin-codeine 100-10 MG/5ML syrup Take 10 mLs by mouth every 6 (six) hours as needed for cough. 03/25/17 03/29/17  Little, Traci M, PA-C  predniSONE (STERAPRED UNI-PAK 21 TAB) 10 MG (21) TBPK tablet Take 6 tablets on day 1. Take 5 tablets on day 2. Take 4 tablets on day 3. Take 3 tablets on day 4. Take 2 tablets on day 5. Take 1 tablets on day 6. 03/25/17   Little, Traci M, PA-C  Prenatal Vit-Fe Fumarate-FA (PRENATAL MULTIVITAMIN) TABS tablet Take 1 tablet by mouth daily at 12 noon.    [provider]    Allergies Other; Erythromycin; Erythromycin base; Latex; Lorazepam; and Metoclopramide  Family History  Problem Relation Age of Onset  . Cancer Father   . Hepatitis B Father   . Hepatitis C Father   . Diabetes Mother   . Hepatitis B Mother   . Hepatitis C Mother   . Osteoarthritis Mother     Social History Social History   Tobacco Use  . Smoking status: Current Every Day Smoker    Packs/day: 0.25    Years: 15.00    Pack years: 3.75    Types: Cigarettes  . Smokeless tobacco: Never Used  Substance Use Topics  . Alcohol use: No  . Drug use: Yes    Types: Other-see comments, Cocaine    Comment: last cocaine use at [redacted] weeks gestation; opioid use Nov 2016     Review of Systems  Constitutional: No fever/chills Eyes: No visual changes. No discharge ENT: No upper respiratory complaints. Cardiovascular: no chest pain. Respiratory: Patient has cough and SOB.  Gastrointestinal: No abdominal pain.  No nausea, no vomiting.  No diarrhea.  No constipation. Musculoskeletal: Negative for  musculoskeletal pain. Skin: Negative for rash, abrasions, lacerations, ecchymosis. Neurological: Negative for headaches, focal weakness or numbness.   ____________________________________________   PHYSICAL EXAM:  VITAL SIGNS: ED Triage Vitals  Enc Vitals Group     BP 03/27/17 1907 (!) 158/73     Pulse Rate 03/27/17 1907 88     Resp 03/27/17 1907 18     Temp 03/27/17 1907 98.7 F (37.1 C)     Temp Source 03/27/17 1907 Oral     SpO2 03/27/17 1907 100 %     Weight 03/27/17 1850 170 lb (77.1 kg)     Height 03/27/17 1850 5\' 3"  (1.6 m)     Head Circumference --      Peak Flow --      Pain Score 03/27/17 1849 7     Pain Loc --      Pain Edu? --      Excl. in [place]? --      Constitutional: Alert and oriented.  Well appearing and in no acute distress. Eyes: Conjunctivae are normal. PERRL. EOMI. Head: Atraumatic. ENT:      Ears: TMs are pearly.       Nose: No congestion/rhinnorhea.      Mouth/Throat: Mucous membranes are moist.  Neck: FROM.  Cardiovascular: Normal rate, regular rhythm. Normal S1 and S2.  Good peripheral circulation. Respiratory: Normal respiratory effort without tachypnea or retractions.  Patient has diffuse wheezing auscultated bilaterally. Good air entry to the bases with no decreased or absent breath sounds. Gastrointestinal: Bowel sounds 4 quadrants. Soft and nontender to palpation. No guarding or rigidity. No palpable masses. No distention. No CVA tenderness. Musculoskeletal: Full range of motion to all extremities. No gross deformities appreciated. Neurologic:  Normal speech and language. No gross focal neurologic deficits are appreciated.  Skin:  Skin is warm, dry and intact. No rash noted. Psychiatric: Mood and affect are normal. Speech and behavior are normal. Patient exhibits appropriate insight and judgement.   ____________________________________________   LABS (all labs ordered are listed, but only abnormal results are displayed)  Labs Reviewed -  No data to display ____________________________________________  EKG   ____________________________________________  RADIOLOGY  No results found.  ____________________________________________    PROCEDURES  Procedure(s) performed:    Procedures    Medications  methylPREDNISolone sodium succinate (SOLU-MEDROL) 125 mg/2 mL injection 125 mg (125 mg Intravenous Given 03/27/17 2057)  cefTRIAXone (ROCEPHIN) 1 g in dextrose 5 % 50 mL IVPB (0 g Intravenous Stopped 03/27/17 2130)  ipratropium-albuterol (DUONEB) 0.5-2.5 (3) MG/3ML nebulizer solution 3 mL (3 mLs Nebulization Given 03/27/17 2058)     ____________________________________________   INITIAL IMPRESSION / ASSESSMENT AND PLAN / ED COURSE  Pertinent labs & imaging results that were available during my care of the patient were reviewed by me and considered in my medical decision making (see chart for details).  Review of the [place] CSRS was performed in accordance of the [place] prior to dispensing any controlled drugs.     Assessment and Plan:  Community-acquired pneumonia Patient presents to the emergency department with worsening shortness of breath and fatigue.  Patient was originally treated with amoxicillin and prednisone.  Patient was treated empirically for community-acquired pneumonia in the emergency department.  She was given ceftriaxone and discharged with azithromycin.  Advised to return to the emergency department for new or worsening symptoms.  She was advised to follow-up with primary care in 1 week.  Vital signs are reassuring prior to discharge.  All patient questions were answered.   ____________________________________________  FINAL CLINICAL IMPRESSION(S) / ED DIAGNOSES  Final diagnoses:  Community acquired pneumonia, unspecified laterality      NEW MEDICATIONS STARTED DURING THIS VISIT:  This SmartLink is deprecated. Use AVSMEDLIST instead to display the medication list for a  patient.      This chart was dictated using voice recognition software/Dragon. Despite best efforts to proofread, errors can occur which can change the meaning. Any change was purely unintentional.    Lannie Fields, PA-C 03/27/17 2240    Schuyler Amor, MD 03/27/17 2253

## 2017-07-01 DIAGNOSIS — F332 Major depressive disorder, recurrent severe without psychotic features: Secondary | ICD-10-CM | POA: Diagnosis not present

## 2017-10-30 DIAGNOSIS — I2510 Atherosclerotic heart disease of native coronary artery without angina pectoris: Secondary | ICD-10-CM | POA: Diagnosis not present

## 2017-10-30 DIAGNOSIS — Z825 Family history of asthma and other chronic lower respiratory diseases: Secondary | ICD-10-CM | POA: Diagnosis not present

## 2017-10-30 DIAGNOSIS — C679 Malignant neoplasm of bladder, unspecified: Secondary | ICD-10-CM | POA: Diagnosis not present

## 2017-10-30 DIAGNOSIS — Z7982 Long term (current) use of aspirin: Secondary | ICD-10-CM | POA: Diagnosis not present

## 2017-10-30 DIAGNOSIS — F17210 Nicotine dependence, cigarettes, uncomplicated: Secondary | ICD-10-CM | POA: Diagnosis not present

## 2017-10-30 DIAGNOSIS — E669 Obesity, unspecified: Secondary | ICD-10-CM | POA: Diagnosis not present

## 2017-10-30 DIAGNOSIS — G959 Disease of spinal cord, unspecified: Secondary | ICD-10-CM | POA: Diagnosis not present

## 2017-10-30 DIAGNOSIS — R079 Chest pain, unspecified: Secondary | ICD-10-CM | POA: Diagnosis not present

## 2017-10-30 DIAGNOSIS — I10 Essential (primary) hypertension: Secondary | ICD-10-CM | POA: Diagnosis not present

## 2017-10-30 DIAGNOSIS — E119 Type 2 diabetes mellitus without complications: Secondary | ICD-10-CM | POA: Diagnosis not present

## 2017-10-30 DIAGNOSIS — Z5329 Procedure and treatment not carried out because of patient's decision for other reasons: Secondary | ICD-10-CM | POA: Diagnosis not present

## 2017-10-30 DIAGNOSIS — R072 Precordial pain: Secondary | ICD-10-CM | POA: Diagnosis not present

## 2017-10-30 DIAGNOSIS — Z955 Presence of coronary angioplasty implant and graft: Secondary | ICD-10-CM | POA: Diagnosis not present

## 2017-10-30 DIAGNOSIS — I252 Old myocardial infarction: Secondary | ICD-10-CM | POA: Diagnosis not present

## 2017-10-30 DIAGNOSIS — Z794 Long term (current) use of insulin: Secondary | ICD-10-CM | POA: Diagnosis not present

## 2017-10-30 DIAGNOSIS — Z981 Arthrodesis status: Secondary | ICD-10-CM | POA: Diagnosis not present

## 2017-11-16 IMAGING — CR DG CHEST 2V
1 series · 2 of 2 positions shown · non-contrast
Comparison: 06/23/2009

CLINICAL DATA: Cough productive of thick sputum.

EXAM:
CHEST  2 VIEW

[Series 1: dg chest 2 view · 0.14mm/px · 2 of 2 slices shown]
[im 1/2]
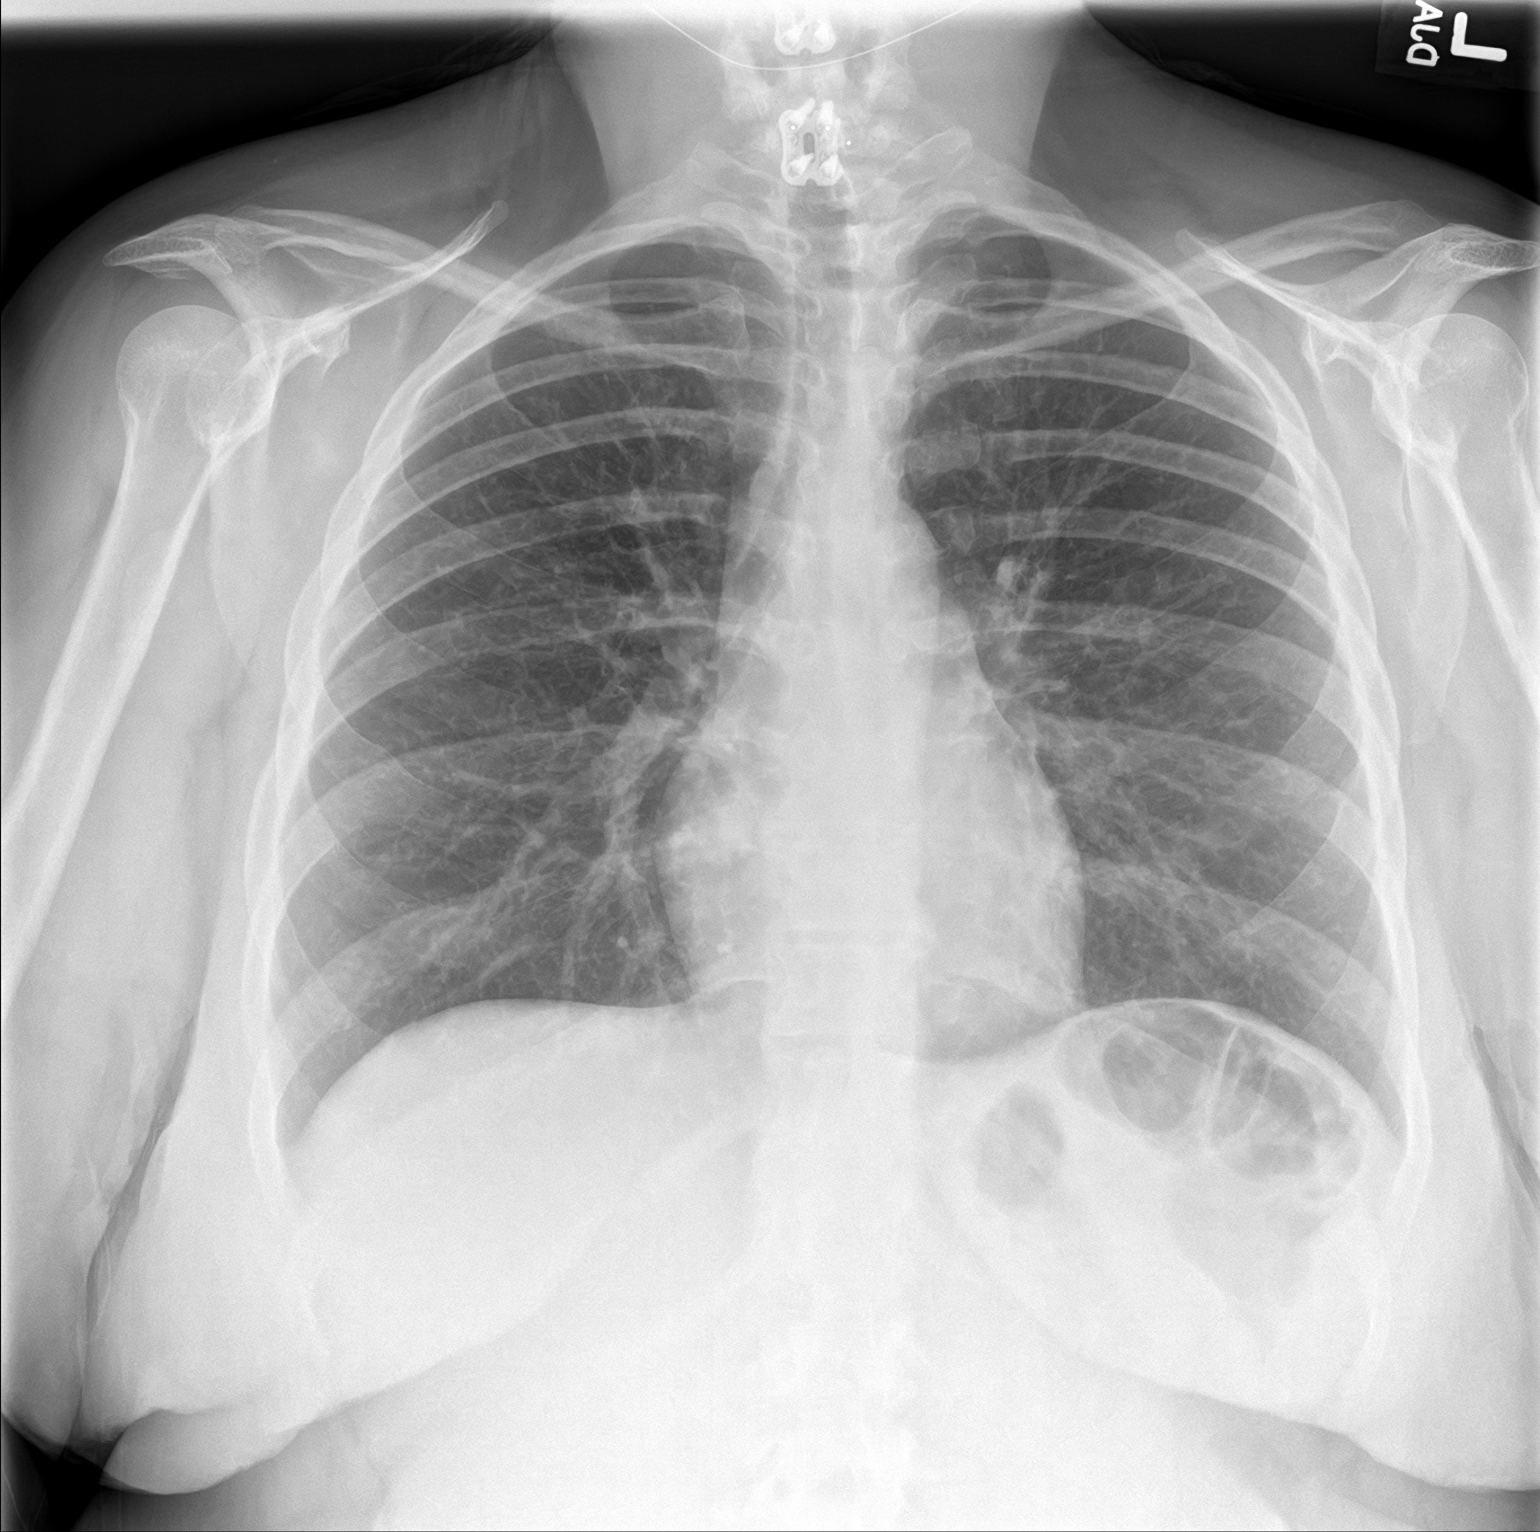
[im 2/2]
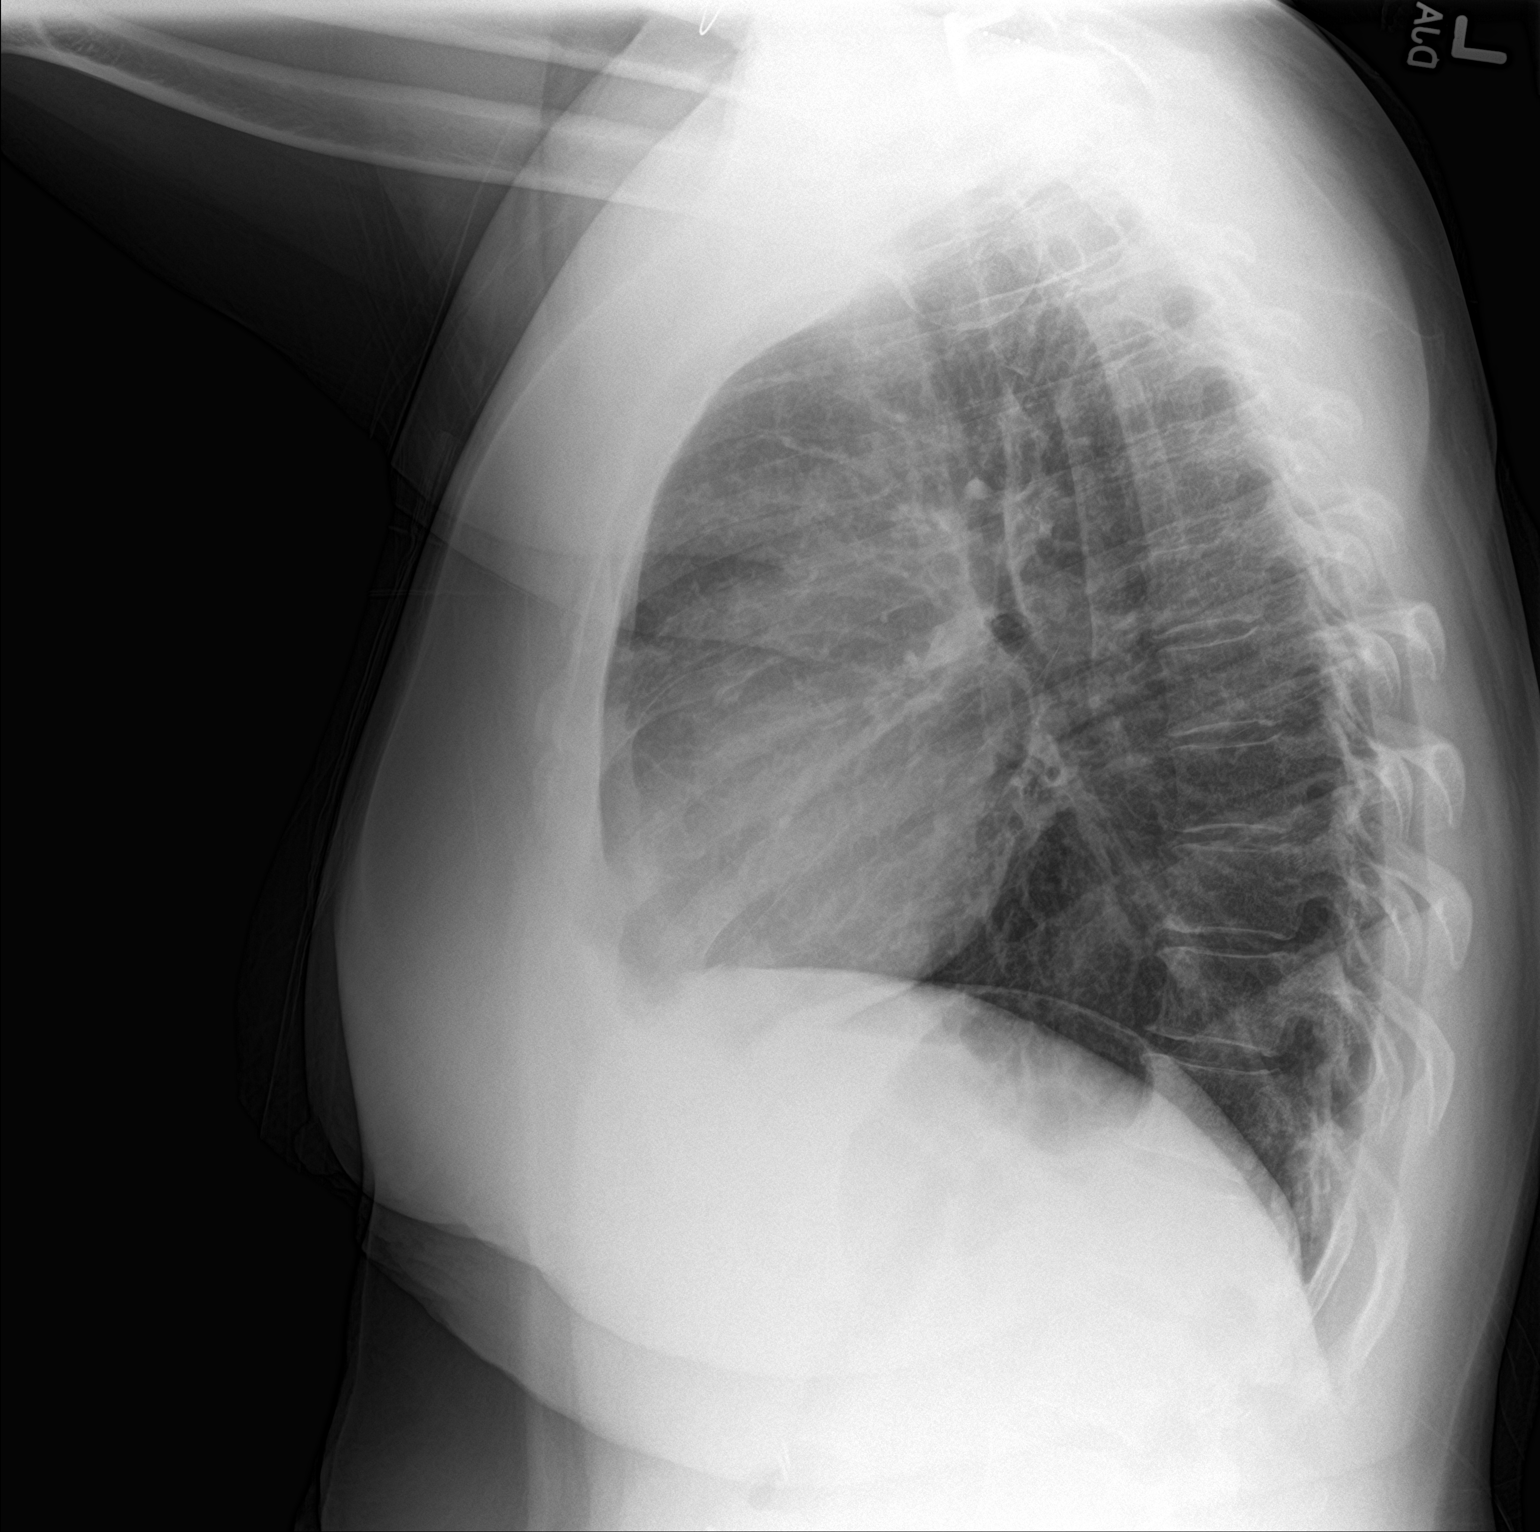

[2 of 2 positions shown; findings below may reference images not displayed]

FINDINGS: The heart size and mediastinal contours are within normal limits. No
pulmonary consolidation, effusion or pneumothorax. Mild perihilar
slightly coarsened interstitial lung markings may reflect chronic
bronchitic change. Status post ACDF of the included cervical spine.
IMPRESSION: 1. Mild chronic bronchitic change with coarsened interstitial lung
markings about the hila.
2. No pneumonic consolidation.

## 2018-01-09 DIAGNOSIS — R197 Diarrhea, unspecified: Secondary | ICD-10-CM | POA: Diagnosis not present

## 2018-01-09 DIAGNOSIS — R110 Nausea: Secondary | ICD-10-CM | POA: Diagnosis not present

## 2018-01-09 DIAGNOSIS — O99281 Endocrine, nutritional and metabolic diseases complicating pregnancy, first trimester: Secondary | ICD-10-CM | POA: Diagnosis not present

## 2018-01-09 DIAGNOSIS — E282 Polycystic ovarian syndrome: Secondary | ICD-10-CM | POA: Diagnosis not present

## 2018-01-09 DIAGNOSIS — O99331 Smoking (tobacco) complicating pregnancy, first trimester: Secondary | ICD-10-CM | POA: Diagnosis not present

## 2018-01-09 DIAGNOSIS — R109 Unspecified abdominal pain: Secondary | ICD-10-CM | POA: Diagnosis not present

## 2018-01-09 DIAGNOSIS — Z3A11 11 weeks gestation of pregnancy: Secondary | ICD-10-CM | POA: Diagnosis not present

## 2018-01-09 DIAGNOSIS — O9989 Other specified diseases and conditions complicating pregnancy, childbirth and the puerperium: Secondary | ICD-10-CM | POA: Diagnosis not present

## 2018-01-09 DIAGNOSIS — O219 Vomiting of pregnancy, unspecified: Secondary | ICD-10-CM | POA: Diagnosis not present

## 2018-01-09 DIAGNOSIS — O26891 Other specified pregnancy related conditions, first trimester: Secondary | ICD-10-CM | POA: Diagnosis not present

## 2018-01-09 DIAGNOSIS — R1030 Lower abdominal pain, unspecified: Secondary | ICD-10-CM | POA: Diagnosis not present

## 2018-01-09 DIAGNOSIS — F17210 Nicotine dependence, cigarettes, uncomplicated: Secondary | ICD-10-CM | POA: Diagnosis not present

## 2018-02-06 DIAGNOSIS — Z3A15 15 weeks gestation of pregnancy: Secondary | ICD-10-CM | POA: Diagnosis not present

## 2018-02-06 DIAGNOSIS — O162 Unspecified maternal hypertension, second trimester: Secondary | ICD-10-CM | POA: Diagnosis not present

## 2018-02-06 DIAGNOSIS — O24912 Unspecified diabetes mellitus in pregnancy, second trimester: Secondary | ICD-10-CM | POA: Diagnosis not present

## 2018-02-06 DIAGNOSIS — O0942 Supervision of pregnancy with grand multiparity, second trimester: Secondary | ICD-10-CM | POA: Diagnosis not present

## 2018-02-06 DIAGNOSIS — Z113 Encounter for screening for infections with a predominantly sexual mode of transmission: Secondary | ICD-10-CM | POA: Diagnosis not present

## 2018-02-06 DIAGNOSIS — O09522 Supervision of elderly multigravida, second trimester: Secondary | ICD-10-CM | POA: Diagnosis not present

## 2018-02-06 DIAGNOSIS — Z130 Encounter for screening for diseases of the blood and blood-forming organs and certain disorders involving the immune mechanism: Secondary | ICD-10-CM | POA: Diagnosis not present

## 2018-02-06 DIAGNOSIS — Z1159 Encounter for screening for other viral diseases: Secondary | ICD-10-CM | POA: Diagnosis not present

## 2018-02-06 DIAGNOSIS — Z114 Encounter for screening for human immunodeficiency virus [HIV]: Secondary | ICD-10-CM | POA: Diagnosis not present

## 2018-02-06 DIAGNOSIS — O34211 Maternal care for low transverse scar from previous cesarean delivery: Secondary | ICD-10-CM | POA: Diagnosis not present

## 2018-02-06 DIAGNOSIS — Z118 Encounter for screening for other infectious and parasitic diseases: Secondary | ICD-10-CM | POA: Diagnosis not present

## 2018-02-16 DIAGNOSIS — O10912 Unspecified pre-existing hypertension complicating pregnancy, second trimester: Secondary | ICD-10-CM | POA: Diagnosis not present

## 2018-02-16 DIAGNOSIS — O99612 Diseases of the digestive system complicating pregnancy, second trimester: Secondary | ICD-10-CM | POA: Diagnosis not present

## 2018-02-16 DIAGNOSIS — O2622 Pregnancy care for patient with recurrent pregnancy loss, second trimester: Secondary | ICD-10-CM | POA: Diagnosis not present

## 2018-02-16 DIAGNOSIS — Z3A16 16 weeks gestation of pregnancy: Secondary | ICD-10-CM | POA: Diagnosis not present

## 2018-02-16 DIAGNOSIS — K0889 Other specified disorders of teeth and supporting structures: Secondary | ICD-10-CM | POA: Diagnosis not present

## 2018-02-16 DIAGNOSIS — O09522 Supervision of elderly multigravida, second trimester: Secondary | ICD-10-CM | POA: Diagnosis not present

## 2018-02-16 DIAGNOSIS — R946 Abnormal results of thyroid function studies: Secondary | ICD-10-CM | POA: Diagnosis not present

## 2018-02-16 DIAGNOSIS — I252 Old myocardial infarction: Secondary | ICD-10-CM | POA: Diagnosis not present

## 2018-02-16 DIAGNOSIS — O0942 Supervision of pregnancy with grand multiparity, second trimester: Secondary | ICD-10-CM | POA: Diagnosis not present

## 2018-02-16 DIAGNOSIS — O24112 Pre-existing diabetes mellitus, type 2, in pregnancy, second trimester: Secondary | ICD-10-CM | POA: Diagnosis not present

## 2018-02-16 DIAGNOSIS — O09212 Supervision of pregnancy with history of pre-term labor, second trimester: Secondary | ICD-10-CM | POA: Diagnosis not present

## 2018-02-16 DIAGNOSIS — Z7982 Long term (current) use of aspirin: Secondary | ICD-10-CM | POA: Diagnosis not present

## 2018-02-16 DIAGNOSIS — O99412 Diseases of the circulatory system complicating pregnancy, second trimester: Secondary | ICD-10-CM | POA: Diagnosis not present

## 2018-02-16 DIAGNOSIS — O26892 Other specified pregnancy related conditions, second trimester: Secondary | ICD-10-CM | POA: Diagnosis not present

## 2018-02-16 DIAGNOSIS — E119 Type 2 diabetes mellitus without complications: Secondary | ICD-10-CM | POA: Diagnosis not present

## 2018-02-16 DIAGNOSIS — O34212 Maternal care for vertical scar from previous cesarean delivery: Secondary | ICD-10-CM | POA: Diagnosis not present

## 2018-02-28 DIAGNOSIS — F17210 Nicotine dependence, cigarettes, uncomplicated: Secondary | ICD-10-CM | POA: Diagnosis not present

## 2018-02-28 DIAGNOSIS — I10 Essential (primary) hypertension: Secondary | ICD-10-CM | POA: Diagnosis not present

## 2018-02-28 DIAGNOSIS — I2510 Atherosclerotic heart disease of native coronary artery without angina pectoris: Secondary | ICD-10-CM | POA: Diagnosis not present

## 2018-03-02 DIAGNOSIS — O2622 Pregnancy care for patient with recurrent pregnancy loss, second trimester: Secondary | ICD-10-CM | POA: Diagnosis not present

## 2018-03-02 DIAGNOSIS — O10912 Unspecified pre-existing hypertension complicating pregnancy, second trimester: Secondary | ICD-10-CM | POA: Diagnosis not present

## 2018-03-02 DIAGNOSIS — O34212 Maternal care for vertical scar from previous cesarean delivery: Secondary | ICD-10-CM | POA: Diagnosis not present

## 2018-03-02 DIAGNOSIS — Z7982 Long term (current) use of aspirin: Secondary | ICD-10-CM | POA: Diagnosis not present

## 2018-03-02 DIAGNOSIS — E119 Type 2 diabetes mellitus without complications: Secondary | ICD-10-CM | POA: Diagnosis not present

## 2018-03-02 DIAGNOSIS — G8929 Other chronic pain: Secondary | ICD-10-CM | POA: Diagnosis not present

## 2018-03-02 DIAGNOSIS — K0889 Other specified disorders of teeth and supporting structures: Secondary | ICD-10-CM | POA: Diagnosis not present

## 2018-03-02 DIAGNOSIS — O99412 Diseases of the circulatory system complicating pregnancy, second trimester: Secondary | ICD-10-CM | POA: Diagnosis not present

## 2018-03-02 DIAGNOSIS — O99352 Diseases of the nervous system complicating pregnancy, second trimester: Secondary | ICD-10-CM | POA: Diagnosis not present

## 2018-03-02 DIAGNOSIS — I252 Old myocardial infarction: Secondary | ICD-10-CM | POA: Diagnosis not present

## 2018-03-02 DIAGNOSIS — Z3A18 18 weeks gestation of pregnancy: Secondary | ICD-10-CM | POA: Diagnosis not present

## 2018-03-02 DIAGNOSIS — K660 Peritoneal adhesions (postprocedural) (postinfection): Secondary | ICD-10-CM | POA: Diagnosis not present

## 2018-03-02 DIAGNOSIS — R946 Abnormal results of thyroid function studies: Secondary | ICD-10-CM | POA: Diagnosis not present

## 2018-03-02 DIAGNOSIS — O09212 Supervision of pregnancy with history of pre-term labor, second trimester: Secondary | ICD-10-CM | POA: Diagnosis not present

## 2018-03-02 DIAGNOSIS — O26892 Other specified pregnancy related conditions, second trimester: Secondary | ICD-10-CM | POA: Diagnosis not present

## 2018-03-02 DIAGNOSIS — O99612 Diseases of the digestive system complicating pregnancy, second trimester: Secondary | ICD-10-CM | POA: Diagnosis not present

## 2018-03-02 DIAGNOSIS — O99322 Drug use complicating pregnancy, second trimester: Secondary | ICD-10-CM | POA: Diagnosis not present

## 2018-03-02 DIAGNOSIS — O24112 Pre-existing diabetes mellitus, type 2, in pregnancy, second trimester: Secondary | ICD-10-CM | POA: Diagnosis not present

## 2018-03-02 DIAGNOSIS — O0942 Supervision of pregnancy with grand multiparity, second trimester: Secondary | ICD-10-CM | POA: Diagnosis not present

## 2018-03-02 DIAGNOSIS — O09512 Supervision of elderly primigravida, second trimester: Secondary | ICD-10-CM | POA: Diagnosis not present

## 2018-03-02 DIAGNOSIS — F1111 Opioid abuse, in remission: Secondary | ICD-10-CM | POA: Diagnosis not present

## 2018-03-07 DIAGNOSIS — O09519 Supervision of elderly primigravida, unspecified trimester: Secondary | ICD-10-CM | POA: Diagnosis not present

## 2018-03-07 DIAGNOSIS — G8921 Chronic pain due to trauma: Secondary | ICD-10-CM | POA: Diagnosis not present

## 2018-03-07 DIAGNOSIS — O10919 Unspecified pre-existing hypertension complicating pregnancy, unspecified trimester: Secondary | ICD-10-CM | POA: Diagnosis not present

## 2018-03-07 DIAGNOSIS — F419 Anxiety disorder, unspecified: Secondary | ICD-10-CM | POA: Diagnosis not present

## 2018-03-15 DIAGNOSIS — I2510 Atherosclerotic heart disease of native coronary artery without angina pectoris: Secondary | ICD-10-CM | POA: Diagnosis not present

## 2018-04-04 DIAGNOSIS — O10919 Unspecified pre-existing hypertension complicating pregnancy, unspecified trimester: Secondary | ICD-10-CM | POA: Diagnosis not present

## 2018-04-04 DIAGNOSIS — G8921 Chronic pain due to trauma: Secondary | ICD-10-CM | POA: Diagnosis not present

## 2018-04-04 DIAGNOSIS — O09519 Supervision of elderly primigravida, unspecified trimester: Secondary | ICD-10-CM | POA: Diagnosis not present

## 2018-04-04 DIAGNOSIS — O24119 Pre-existing diabetes mellitus, type 2, in pregnancy, unspecified trimester: Secondary | ICD-10-CM | POA: Diagnosis not present

## 2018-04-25 DIAGNOSIS — O34219 Maternal care for unspecified type scar from previous cesarean delivery: Secondary | ICD-10-CM | POA: Diagnosis not present

## 2018-04-25 DIAGNOSIS — G8929 Other chronic pain: Secondary | ICD-10-CM | POA: Diagnosis not present

## 2018-04-25 DIAGNOSIS — O99322 Drug use complicating pregnancy, second trimester: Secondary | ICD-10-CM | POA: Diagnosis not present

## 2018-04-25 DIAGNOSIS — O365920 Maternal care for other known or suspected poor fetal growth, second trimester, not applicable or unspecified: Secondary | ICD-10-CM | POA: Diagnosis not present

## 2018-04-25 DIAGNOSIS — O09519 Supervision of elderly primigravida, unspecified trimester: Secondary | ICD-10-CM | POA: Diagnosis not present

## 2018-04-25 DIAGNOSIS — E119 Type 2 diabetes mellitus without complications: Secondary | ICD-10-CM | POA: Diagnosis not present

## 2018-04-25 DIAGNOSIS — O99412 Diseases of the circulatory system complicating pregnancy, second trimester: Secondary | ICD-10-CM | POA: Diagnosis not present

## 2018-04-25 DIAGNOSIS — I252 Old myocardial infarction: Secondary | ICD-10-CM | POA: Diagnosis not present

## 2018-04-25 DIAGNOSIS — O09512 Supervision of elderly primigravida, second trimester: Secondary | ICD-10-CM | POA: Diagnosis not present

## 2018-04-25 DIAGNOSIS — Z113 Encounter for screening for infections with a predominantly sexual mode of transmission: Secondary | ICD-10-CM | POA: Diagnosis not present

## 2018-04-25 DIAGNOSIS — Z114 Encounter for screening for human immunodeficiency virus [HIV]: Secondary | ICD-10-CM | POA: Diagnosis not present

## 2018-04-25 DIAGNOSIS — O24112 Pre-existing diabetes mellitus, type 2, in pregnancy, second trimester: Secondary | ICD-10-CM | POA: Diagnosis not present

## 2018-04-25 DIAGNOSIS — O1202 Gestational edema, second trimester: Secondary | ICD-10-CM | POA: Diagnosis not present

## 2018-04-25 DIAGNOSIS — R946 Abnormal results of thyroid function studies: Secondary | ICD-10-CM | POA: Diagnosis not present

## 2018-04-25 DIAGNOSIS — F419 Anxiety disorder, unspecified: Secondary | ICD-10-CM | POA: Diagnosis not present

## 2018-04-25 DIAGNOSIS — G8921 Chronic pain due to trauma: Secondary | ICD-10-CM | POA: Diagnosis not present

## 2018-04-25 DIAGNOSIS — O26892 Other specified pregnancy related conditions, second trimester: Secondary | ICD-10-CM | POA: Diagnosis not present

## 2018-04-25 DIAGNOSIS — O2622 Pregnancy care for patient with recurrent pregnancy loss, second trimester: Secondary | ICD-10-CM | POA: Diagnosis not present

## 2018-04-25 DIAGNOSIS — Z7982 Long term (current) use of aspirin: Secondary | ICD-10-CM | POA: Diagnosis not present

## 2018-04-25 DIAGNOSIS — Z3A26 26 weeks gestation of pregnancy: Secondary | ICD-10-CM | POA: Diagnosis not present

## 2018-04-25 DIAGNOSIS — F1111 Opioid abuse, in remission: Secondary | ICD-10-CM | POA: Diagnosis not present

## 2018-04-25 DIAGNOSIS — O99352 Diseases of the nervous system complicating pregnancy, second trimester: Secondary | ICD-10-CM | POA: Diagnosis not present

## 2018-04-25 DIAGNOSIS — R87810 Cervical high risk human papillomavirus (HPV) DNA test positive: Secondary | ICD-10-CM | POA: Diagnosis not present

## 2018-04-25 DIAGNOSIS — O162 Unspecified maternal hypertension, second trimester: Secondary | ICD-10-CM | POA: Diagnosis not present

## 2018-04-25 DIAGNOSIS — O09212 Supervision of pregnancy with history of pre-term labor, second trimester: Secondary | ICD-10-CM | POA: Diagnosis not present

## 2018-04-25 DIAGNOSIS — O98512 Other viral diseases complicating pregnancy, second trimester: Secondary | ICD-10-CM | POA: Diagnosis not present

## 2018-04-25 DIAGNOSIS — I2510 Atherosclerotic heart disease of native coronary artery without angina pectoris: Secondary | ICD-10-CM | POA: Diagnosis not present

## 2018-04-25 DIAGNOSIS — O0942 Supervision of pregnancy with grand multiparity, second trimester: Secondary | ICD-10-CM | POA: Diagnosis not present

## 2018-04-25 DIAGNOSIS — O10919 Unspecified pre-existing hypertension complicating pregnancy, unspecified trimester: Secondary | ICD-10-CM | POA: Diagnosis not present

## 2018-05-08 DIAGNOSIS — B977 Papillomavirus as the cause of diseases classified elsewhere: Secondary | ICD-10-CM | POA: Diagnosis not present

## 2018-05-08 DIAGNOSIS — O09523 Supervision of elderly multigravida, third trimester: Secondary | ICD-10-CM | POA: Diagnosis not present

## 2018-05-08 DIAGNOSIS — O359XX0 Maternal care for (suspected) fetal abnormality and damage, unspecified, not applicable or unspecified: Secondary | ICD-10-CM | POA: Diagnosis not present

## 2018-05-08 DIAGNOSIS — Q211 Atrial septal defect: Secondary | ICD-10-CM | POA: Diagnosis not present

## 2018-05-08 DIAGNOSIS — Z3A28 28 weeks gestation of pregnancy: Secondary | ICD-10-CM | POA: Diagnosis not present

## 2018-05-08 DIAGNOSIS — O99333 Smoking (tobacco) complicating pregnancy, third trimester: Secondary | ICD-10-CM | POA: Diagnosis not present

## 2018-05-08 DIAGNOSIS — O98313 Other infections with a predominantly sexual mode of transmission complicating pregnancy, third trimester: Secondary | ICD-10-CM | POA: Diagnosis not present

## 2018-05-08 DIAGNOSIS — E119 Type 2 diabetes mellitus without complications: Secondary | ICD-10-CM | POA: Diagnosis not present

## 2018-05-08 DIAGNOSIS — O24119 Pre-existing diabetes mellitus, type 2, in pregnancy, unspecified trimester: Secondary | ICD-10-CM | POA: Diagnosis not present

## 2018-05-08 DIAGNOSIS — F17210 Nicotine dependence, cigarettes, uncomplicated: Secondary | ICD-10-CM | POA: Diagnosis not present

## 2018-05-08 DIAGNOSIS — O99419 Diseases of the circulatory system complicating pregnancy, unspecified trimester: Secondary | ICD-10-CM | POA: Diagnosis not present

## 2018-05-08 DIAGNOSIS — O24313 Unspecified pre-existing diabetes mellitus in pregnancy, third trimester: Secondary | ICD-10-CM | POA: Diagnosis not present

## 2018-05-15 DIAGNOSIS — O09519 Supervision of elderly primigravida, unspecified trimester: Secondary | ICD-10-CM | POA: Diagnosis not present

## 2018-05-15 DIAGNOSIS — O365930 Maternal care for other known or suspected poor fetal growth, third trimester, not applicable or unspecified: Secondary | ICD-10-CM | POA: Diagnosis not present

## 2018-05-15 DIAGNOSIS — Z98891 History of uterine scar from previous surgery: Secondary | ICD-10-CM | POA: Diagnosis not present

## 2018-05-15 DIAGNOSIS — O10919 Unspecified pre-existing hypertension complicating pregnancy, unspecified trimester: Secondary | ICD-10-CM | POA: Diagnosis not present

## 2018-05-15 DIAGNOSIS — O24319 Unspecified pre-existing diabetes mellitus in pregnancy, unspecified trimester: Secondary | ICD-10-CM | POA: Diagnosis not present

## 2018-05-23 DIAGNOSIS — O09519 Supervision of elderly primigravida, unspecified trimester: Secondary | ICD-10-CM | POA: Diagnosis not present

## 2023-05-30 ENCOUNTER — Encounter: Payer: Self-pay | Admitting: Emergency Medicine

## 2023-05-30 ENCOUNTER — Ambulatory Visit
Admission: EM | Admit: 2023-05-30 | Discharge: 2023-05-30 | Disposition: A | Discharge status: Home / Self Care | Payer: Medicare Other | Attending: Emergency Medicine | Admitting: Emergency Medicine

## 2023-05-30 DIAGNOSIS — E119 Type 2 diabetes mellitus without complications: Secondary | ICD-10-CM | POA: Diagnosis not present

## 2023-05-30 DIAGNOSIS — J069 Acute upper respiratory infection, unspecified: Secondary | ICD-10-CM | POA: Diagnosis not present

## 2023-05-30 DIAGNOSIS — R059 Cough, unspecified: Secondary | ICD-10-CM | POA: Diagnosis present

## 2023-05-30 DIAGNOSIS — I2510 Atherosclerotic heart disease of native coronary artery without angina pectoris: Secondary | ICD-10-CM | POA: Insufficient documentation

## 2023-05-30 DIAGNOSIS — B9789 Other viral agents as the cause of diseases classified elsewhere: Secondary | ICD-10-CM | POA: Diagnosis not present

## 2023-05-30 DIAGNOSIS — Z1152 Encounter for screening for COVID-19: Secondary | ICD-10-CM | POA: Insufficient documentation

## 2023-05-30 DIAGNOSIS — I10 Essential (primary) hypertension: Secondary | ICD-10-CM | POA: Diagnosis not present

## 2023-05-30 DIAGNOSIS — F17210 Nicotine dependence, cigarettes, uncomplicated: Secondary | ICD-10-CM | POA: Insufficient documentation

## 2023-05-30 LAB — RESP PANEL BY RT-PCR (FLU A&B, COVID) ARPGX2
Influenza A by PCR: NEGATIVE
Influenza B by PCR: NEGATIVE
SARS Coronavirus 2 by RT PCR: NEGATIVE

## 2023-05-30 MED ORDER — BENZONATATE 100 MG PO CAPS: 200.0000 mg | ORAL_CAPSULE | Freq: Three times a day (TID) | ORAL | 0 refills | Status: AC

## 2023-05-30 MED ORDER — PROMETHAZINE-DM 6.25-15 MG/5ML PO SYRP: 5.0000 mL | ORAL_SOLUTION | Freq: Four times a day (QID) | ORAL | 0 refills | Status: AC | PRN

## 2023-05-30 MED ORDER — IPRATROPIUM BROMIDE 0.06 % NA SOLN: 2.0000 | Freq: Four times a day (QID) | NASAL | 12 refills | Status: AC

## 2023-05-30 NOTE — ED Provider Notes (Signed)
 [facility]    CSN: [id number] Arrival date & time: 05/30/23  1422      History   Chief Complaint Chief Complaint  Patient presents with   Fever   Cough   Nasal Congestion    HPI Nichole Wallace is a 48 y.o. female.   HPI  48 year old female with past medical history significant for CAD, MI, hypertension, diabetes, and tobacco use presents for evaluation of 2 days worth of respiratory symptoms which include a fever with a Tmax 102, runny nose and nasal congestion, productive cough for yellow sputum, shortness breath, and wheezing.  She denies any ear pain, sore throat, or GI symptoms.  She was recently in the hospital with her mother who was admitted for RSV and influenza.  Past Medical History:  Diagnosis Date   Anxiety    Appendicitis    CAD in native artery 10/02/2014   Cervical spinal cord compression (HCC) 07/04/2013   Chronic pain 11/28/2013   Cocaine  abuse (HCC)    Depression    Diabetes (HCC) 04/09/2013   Diabetes mellitus without complication (HCC)    Heart attack (HCC) 04/09/2013   Overview:  History per patient    Hypertension    Lumbar disc disease with radiculopathy 11/30/2013   Myocardial infarction [facility])    Preterm labor    6 losses early on   Ruptured appendix    S/P coronary artery balloon dilation 04/09/2013   Overview:  Overview:  History per patient     Patient Active Problem List   Diagnosis Date Noted   Amniotic fluid leaking 01/11/2016   Pregnancy 01/10/2016   Positive urine drug screen 12/09/2015   Depression 12/03/2015   History of cesarean section 12/03/2015   Late prenatal care 12/03/2015   Advanced maternal age in multigravida 12/03/2015   History of substance use 12/03/2015   Tobacco user 12/03/2015   History of more than 3 abortions 12/03/2015   History of MI (myocardial infarction) 12/03/2015   History of chronic hypertension 12/03/2015   Encounter for screening for diabetes mellitus 12/03/2015   Ruptured appendix     Antepartum diabetes mellitus 12/17/2014   Maternal tobacco use 10/22/2014   Habitual aborter, 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   Anxiety and depression 10/22/2014   Abnormal thyroid  stimulating hormone (TSH) level 10/11/2014   Presence of stent in right coronary artery 10/02/2014   CAD in native artery 10/02/2014   Supervision of elderly primigravida 09/04/2014   Chronic pain due to injury 09/04/2014   Chronic hypertension in pregnancy 09/04/2014   Benign essential hypertension in obstetric context 09/04/2014   H/O acute myocardial infarction 08/29/2014   Arthropathy of lumbar facet joint 11/30/2013   Lumbar disc disease with radiculopathy 11/30/2013   Cervical disc disease 11/28/2013   H/O arthrodesis 07/04/2013   Cervical spinal cord compression (HCC) 07/04/2013   Cervical myelopathy (HCC) 07/04/2013   Cervical spinal stenosis 06/15/2013   Compulsive tobacco user syndrome 06/14/2013   Current tobacco use 06/14/2013   Diabetes mellitus, type 2 (HCC) 04/09/2013   S/P coronary artery balloon dilation 04/09/2013   Heart attack (HCC) 04/09/2013   Presence of stent in coronary artery 04/09/2013   Diabetes (HCC) 04/09/2013   Spondylopathy 04/09/2013   Acute MI (HCC) 04/09/2013    Past Surgical History:  Procedure Laterality Date   APPENDECTOMY N/A 01/12/2015   Procedure: APPENDECTOMY;  Surgeon: Lonni Brands, MD;  Location: [facility] ORS;  Service: General;  Laterality: N/A;   CESAREAN SECTION  01/26/2015   [facility]  CORONARY ANGIOPLASTY WITH STENT PLACEMENT     DILATION AND CURETTAGE OF UTERUS     GALLBLADDER SURGERY     neck fusion     TONSILLECTOMY      OB History     Gravida  8   Para  2   Term  0   Preterm  2   AB  5   Living  1      SAB  5   IAB  0   Ectopic  0   Multiple  0   Live Births  1            Home Medications    Prior to Admission medications   Medication Sig Start Date End Date Taking? Authorizing Provider  aspirin 81 MG tablet  Take 81 mg by mouth daily.   Yes [provider]  benzonatate  (TESSALON ) 100 MG capsule Take 2 capsules (200 mg total) by mouth every 8 (eight) hours. 05/30/23  Yes Bernardino Ditch, NP  citalopram (CELEXA) 20 MG tablet  05/11/22  Yes [provider]  ipratropium (ATROVENT ) 0.06 % nasal spray Place 2 sprays into both nostrils 4 (four) times daily. 05/30/23  Yes Bernardino Ditch, NP  promethazine -dextromethorphan (PROMETHAZINE -DM) 6.25-15 MG/5ML syrup Take 5 mLs by mouth 4 (four) times daily as needed. 05/30/23  Yes Bernardino Ditch, NP  enoxaparin  (LOVENOX ) 40 MG/0.4ML injection Inject 0.4 mLs (40 mg total) into the skin daily. Patient not taking: Reported on 01/10/2016 12/25/15 05/23/16  Grotegut, Chad, MD  predniSONE  (STERAPRED UNI-PAK 21 TAB) 10 MG (21) TBPK tablet Take 6 tablets on day 1. Take 5 tablets on day 2. Take 4 tablets on day 3. Take 3 tablets on day 4. Take 2 tablets on day 5. Take 1 tablets on day 6. 03/25/17   Little, Traci M, PA-C  Prenatal Vit-Fe Fumarate-FA (PRENATAL MULTIVITAMIN) TABS tablet Take 1 tablet by mouth daily at 12 noon.    [provider]    Family History Family History  Problem Relation Age of Onset   Cancer Father    Hepatitis B Father    Hepatitis C Father    Diabetes Mother    Hepatitis B Mother    Hepatitis C Mother    Osteoarthritis Mother     Social History Social History   Tobacco Use   Smoking status: Every Day    Current packs/day: 0.25    Average packs/day: 0.3 packs/day for 15.0 years (3.8 ttl pk-yrs)    Types: Cigarettes   Smokeless tobacco: Never  Vaping Use   Vaping status: Never Used  Substance Use Topics   Alcohol use: No   Drug use: Not Currently    Types: Other-see comments, Cocaine     Comment: last cocaine  use at [redacted] weeks gestation; opioid use Nov 2016     Allergies   Other, Erythromycin, Metformin, Erythromycin base, Latex, Lorazepam , and Metoclopramide   Review of Systems Review of Systems  Constitutional:   Positive for fever.  HENT:  Positive for congestion and rhinorrhea. Negative for ear pain and sore throat.   Respiratory:  Positive for cough, shortness of breath and wheezing.   Gastrointestinal:  Negative for diarrhea, nausea and vomiting.  Skin:  Negative for rash.     Physical Exam Triage Vital Signs ED Triage Vitals  Encounter Vitals Group     BP      Systolic BP Percentile      Diastolic BP Percentile      Pulse  Resp      Temp      Temp src      SpO2      Weight      Height      Head Circumference      Peak Flow      Pain Score      Pain Loc      Pain Education      Exclude from Growth Chart    No data found.  Updated Vital Signs BP (!) 135/90 (BP Location: Left Arm)   Pulse 75   Temp 98.6 F (37 C) (Oral)   Resp 18   LMP 04/29/2023 (Approximate)   SpO2 94%   Breastfeeding No   Visual Acuity Right Eye Distance:   Left Eye Distance:   Bilateral Distance:    Right Eye Near:   Left Eye Near:    Bilateral Near:     Physical Exam Vitals and nursing note reviewed.  Constitutional:      Appearance: Normal appearance. She is not ill-appearing.  HENT:     Head: Normocephalic and atraumatic.     Right Ear: Tympanic membrane, ear canal and external ear normal. There is no impacted cerumen.     Left Ear: Tympanic membrane, ear canal and external ear normal. There is no impacted cerumen.     Nose: Congestion and rhinorrhea present.     Comments: Patient mucosa is erythematous and edematous with scant clear discharge in both nares.    Mouth/Throat:     Mouth: Mucous membranes are moist.     Pharynx: Oropharynx is clear. No oropharyngeal exudate or posterior oropharyngeal erythema.  Cardiovascular:     Rate and Rhythm: Normal rate and regular rhythm.     Pulses: Normal pulses.     Heart sounds: Normal heart sounds. No murmur heard.    No friction rub. No gallop.  Pulmonary:     Effort: Pulmonary effort is normal.     Breath sounds: Wheezing present.  No rhonchi or rales.     Comments: Scattered expiratory wheezing. Musculoskeletal:     Cervical back: Normal range of motion and neck supple. No tenderness.  Lymphadenopathy:     Cervical: No cervical adenopathy.  Skin:    General: Skin is warm and dry.     Capillary Refill: Capillary refill takes less than 2 seconds.     Findings: No rash.  Neurological:     General: No focal deficit present.     Mental Status: She is alert and oriented to person, place, and time.      UC Treatments / Results  Labs (all labs ordered are listed, but only abnormal results are displayed) Labs Reviewed  RESP PANEL BY RT-PCR (FLU A&B, COVID) [id number]    EKG   Radiology No results found.  Procedures Procedures (including critical care time)  Medications Ordered in UC Medications - No data to display  Initial Impression / Assessment and Plan / UC Course  I have reviewed the triage vital signs and the nursing notes.  Pertinent labs & imaging results that were available during my care of the patient were reviewed by me and considered in my medical decision making (see chart for details).   Patient is a pleasant, nontoxic-appearing 48 year old female presenting for evaluation of 2 days with respiratory symptoms as outlined HPI above.  In the exam room she is not in any acute distress.  She is able to speak in full sentence without dyspnea or tachypnea.  She does have a room air oxygen saturation of 94% but her respiratory rate at triage was 18.  She is afebrile with an oral temp of 98.6.  Remainder of her physical exam does reveal inflammation of her upper respiratory tract with inflamed nasal mucosa but her rhinorrhea is clear.  No cervical adenopathy appreciated on exam.  Due to exposure to RSV and influenza I will order a respiratory panel to evaluate for the presence of influenza.  Also COVID.  The panel is negative for COVID and influenza.  I will discharge patient home with a diagnosis of  viral URI with cough with prescription for Atrovent  nasal spray, Tessalon  Perles, and Promethazine  DM cough syrup.  Tylenol and/or ibuprofen as needed for fever or pain.   Final Clinical Impressions(s) / UC Diagnoses   Final diagnoses:  Viral URI with cough     Discharge Instructions      The respiratory panel was negative for COVID or influenza.  I do believe you have a viral respiratory infection causing her symptoms.  Use over-the-counter Tylenol and/or ibuprofen according the package instructions as needed for any fever or pain.  Use the Atrovent  nasal spray, 2 squirts in each nostril every 6 hours, as needed for runny nose and postnasal drip.  Use the Tessalon  Perles every 8 hours during the day.  Take them with a small sip of water.  They may give you some numbness to the base of your tongue or a metallic taste in your mouth, this is normal.  Use the Promethazine  DM cough syrup at bedtime for cough and congestion.  It will make you drowsy so do not take it during the day.  Return for reevaluation or see your primary care provider for any new or worsening symptoms.      ED Prescriptions     Medication Sig Dispense Auth. Provider   benzonatate  (TESSALON ) 100 MG capsule Take 2 capsules (200 mg total) by mouth every 8 (eight) hours. 21 capsule Bernardino Ditch, NP   ipratropium (ATROVENT ) 0.06 % nasal spray Place 2 sprays into both nostrils 4 (four) times daily. 15 mL Bernardino Ditch, NP   promethazine -dextromethorphan (PROMETHAZINE -DM) 6.25-15 MG/5ML syrup Take 5 mLs by mouth 4 (four) times daily as needed. 118 mL Bernardino Ditch, NP      PDMP not reviewed this encounter.   Bernardino Ditch, NP 05/30/23 [phone]

## 2023-05-30 NOTE — ED Triage Notes (Signed)
 Pt presents with a fever, cough and runny nose x 2 days. Pt has taken OTC cough syrup for symptoms.

## 2023-05-30 NOTE — Discharge Instructions (Addendum)
 The respiratory panel was negative for COVID or influenza.  I do believe you have a viral respiratory infection causing her symptoms.  Use over-the-counter Tylenol and/or ibuprofen according the package instructions as needed for any fever or pain.  Use the Atrovent  nasal spray, 2 squirts in each nostril every 6 hours, as needed for runny nose and postnasal drip.  Use the Tessalon  Perles every 8 hours during the day.  Take them with a small sip of water.  They may give you some numbness to the base of your tongue or a metallic taste in your mouth, this is normal.  Use the Promethazine  DM cough syrup at bedtime for cough and congestion.  It will make you drowsy so do not take it during the day.  Return for reevaluation or see your primary care provider for any new or worsening symptoms.

## 2023-08-01 ENCOUNTER — Ambulatory Visit
Admission: EM | Admit: 2023-08-01 | Discharge: 2023-08-01 | Disposition: A | Discharge status: Home / Self Care | Attending: Emergency Medicine | Admitting: Emergency Medicine

## 2023-08-01 ENCOUNTER — Encounter: Payer: Self-pay | Admitting: Emergency Medicine

## 2023-08-01 DIAGNOSIS — Z207 Contact with and (suspected) exposure to pediculosis, acariasis and other infestations: Secondary | ICD-10-CM

## 2023-08-01 DIAGNOSIS — B86 Scabies: Secondary | ICD-10-CM | POA: Diagnosis not present

## 2023-08-01 MED ORDER — HYDROXYZINE HCL 25 MG PO TABS: 25.0000 mg | ORAL_TABLET | Freq: Four times a day (QID) | ORAL | 0 refills | Status: AC | PRN

## 2023-08-01 MED ORDER — PERMETHRIN 5 % EX CREA: TOPICAL_CREAM | CUTANEOUS | 2 refills | Status: AC

## 2023-08-01 MED ORDER — HYDROXYZINE HCL 25 MG PO TABS: 25.0000 mg | ORAL_TABLET | Freq: Four times a day (QID) | ORAL | 0 refills | Status: DC | PRN

## 2023-08-01 NOTE — ED Triage Notes (Signed)
 Pt presents with a rash all over her arms x 1 week. Pt believes she has scabies. She tried to treat with OTC lice spray.

## 2023-08-01 NOTE — Discharge Instructions (Signed)
 Scabies is usually transmitted through close physical contact, but may be transmitted through clothing, linens, or towels. Apply the permethrin from head to soles of feet at bedtime, leave on for 8-14 hours. Mites tend to persist under the fingernails. Trim them, scrub beneath them, and apply the permetrhin under your nails. Repeat 1 week later. You will also need to treat family members, frequent household guests, close physical or sexual contacts, even if they have no symptoms. Wash all clothing, bedding, and towels in hot water, dry cleaned, or placed through the heat cycle of a dryer to prevent reinfection. Or you can place all bedding and clothing that might be infected in sealed plastic bags for 72 hours.  Make sure you thoroughly clean the infected person's room. The itching will not go away immediately. Continued itching does not mean that the treatment was ineffective. Dead mites and eggs will cause an immune response but will eventually go away as the skin turns over.   Decrease your salt intake. diet and exercise will lower your blood pressure significantly. It is important to keep your blood pressure under good control, as having a elevated blood pressure for prolonged periods of time significantly increases your risk of stroke, heart attacks, kidney damage, eye damage, and other problems. Get a validated blood pressure cuff that goes on your arm, not your wrist.  Measure your blood pressure once a day, preferably at the same time every day. Keep a log of this and bring it to your next doctor's appointment.  Bring your blood pressure cuff as well.  Return here in 2 weeks for blood pressure recheck if you're unable to find a primary care physician by then. Return immediately to the ER if you start having chest pain, headache, problems seeing, problems talking, problems walking, if you feel like you're about to pass out, if you do pass out, if you have a seizure, or for any other concerns.  Go to  www.goodrx.com  or www.costplusdrugs.com to look up your medications. This will give you a list of where you can find your prescriptions at the most affordable prices. Or ask the pharmacist what the cash price is, or if they have any other discount programs available to help make your medication more affordable. This can be less expensive than what you would pay with insurance.

## 2023-08-01 NOTE — ED Provider Notes (Signed)
 HPI  SUBJECTIVE:  Nichole Wallace is a 48 y.o. female who presents with an intensely pruritic rash that is more itchy at night on her hands, arms, bottom of her feet after a known exposure to scabies.  She states that all family members have an identical rash now.  No blood on the bed clothes in the morning, known exposure to bedbugs.  She states that she has scabs from scratching so much and trying to remove the scabies.  She has been picking at the scabs as well.  She tried rubbing alcohol, removing them manually and an unknown bug spray.  The alcohol, bug spray helped.  No aggravating factors. She has a past medical history of diabetes, MI status post angioplasty, hypertension, chronic pain, coronary artery disease, substance use disorder.  States that she has been clean from cocaine for 3 months.  No history of MRSA.  PCP: None  Past Medical History:  Diagnosis Date   Anxiety    Appendicitis    CAD in native artery 10/02/2014   Cervical spinal cord compression (HCC) 07/04/2013   Chronic pain 11/28/2013   Cocaine abuse (HCC)    Depression    Diabetes (HCC) 04/09/2013   Diabetes mellitus without complication (HCC)    Heart attack (HCC) 04/09/2013   Overview:  History per patient    Hypertension    Lumbar disc disease with radiculopathy 11/30/2013   Myocardial infarction [facility])    Preterm labor    6 losses early on   Ruptured appendix    S/P coronary artery balloon dilation 04/09/2013   Overview:  Overview:  History per patient     Past Surgical History:  Procedure Laterality Date   APPENDECTOMY N/A 01/12/2015   Procedure: APPENDECTOMY;  Surgeon: Ida Rogue, MD;  Location: [facility] ORS;  Service: General;  Laterality: N/A;   CESAREAN SECTION  01/26/2015   [facility]   CORONARY ANGIOPLASTY WITH STENT PLACEMENT     DILATION AND CURETTAGE OF UTERUS     GALLBLADDER SURGERY     neck fusion     TONSILLECTOMY      Family History  Problem Relation Age of Onset   Cancer Father     Hepatitis B Father    Hepatitis C Father    Diabetes Mother    Hepatitis B Mother    Hepatitis C Mother    Osteoarthritis Mother     Social History   Tobacco Use   Smoking status: Every Day    Current packs/day: 0.25    Average packs/day: 0.3 packs/day for 15.0 years (3.8 ttl pk-yrs)    Types: Cigarettes   Smokeless tobacco: Never  Vaping Use   Vaping status: Never Used  Substance Use Topics   Alcohol use: No   Drug use: Not Currently    Types: Other-see comments, Cocaine    Comment: last cocaine use at [redacted] weeks gestation; opioid use Nov 2016    No current facility-administered medications for this encounter.  Current Outpatient Medications:    HUMALOG KWIKPEN 100 UNIT/ML KwikPen, Inject into the skin., Disp: , Rfl:    LANTUS SOLOSTAR 100 UNIT/ML Solostar Pen, Inject into the skin., Disp: , Rfl:    permethrin (ELIMITE) 5 % cream, Apply from chin down, leave on for 8-14 hours, rinse. Repeat in 1 week, Disp: 60 g, Rfl: 2   aspirin 81 MG tablet, Take 81 mg by mouth daily., Disp: , Rfl:    benzonatate (TESSALON) 100 MG capsule, Take 2 capsules (200 mg total)  by mouth every 8 (eight) hours., Disp: 21 capsule, Rfl: 0   citalopram (CELEXA) 20 MG tablet, , Disp: , Rfl:    enoxaparin (LOVENOX) 40 MG/0.4ML injection, Inject 0.4 mLs (40 mg total) into the skin daily. (Patient not taking: Reported on 01/10/2016), Disp: 12 mL, Rfl: 4   hydrOXYzine (ATARAX) 25 MG tablet, Take 1 tablet (25 mg total) by mouth every 6 (six) hours as needed., Disp: 20 tablet, Rfl: 0   ipratropium (ATROVENT) 0.06 % nasal spray, Place 2 sprays into both nostrils 4 (four) times daily., Disp: 15 mL, Rfl: 12   predniSONE (STERAPRED UNI-PAK 21 TAB) 10 MG (21) TBPK tablet, Take 6 tablets on day 1. Take 5 tablets on day 2. Take 4 tablets on day 3. Take 3 tablets on day 4. Take 2 tablets on day 5. Take 1 tablets on day 6., Disp: 21 tablet, Rfl: 0   Prenatal Vit-Fe Fumarate-FA (PRENATAL MULTIVITAMIN) TABS tablet, Take 1  tablet by mouth daily at 12 noon., Disp: , Rfl:    promethazine-dextromethorphan (PROMETHAZINE-DM) 6.25-15 MG/5ML syrup, Take 5 mLs by mouth 4 (four) times daily as needed., Disp: 118 mL, Rfl: 0  Allergies  Allergen Reactions   Other Shortness Of Breath    Pine nuts and anything pine   Erythromycin Other (See Comments)    States she is unsure of reaction to it   Metformin     GI upset - unable to tolerate   Erythromycin Base Rash    Does not remember what happens.   Latex Itching   Lorazepam Anxiety    AGGRESSION Other reaction(s): Anxiety AGGRESSION   Metoclopramide Anxiety    "I want to punch somebody"     ROS  As noted in HPI.   Physical Exam  BP (!) 177/73 (BP Location: Left Arm)   Pulse 71   Temp 98.3 F (36.8 C) (Oral)   Resp 18   LMP  (LMP Unknown)   SpO2 100%   Constitutional: Well developed, well nourished, no acute distress Eyes:  EOMI, conjunctiva normal bilaterally HENT: Normocephalic, atraumatic,mucus membranes moist Respiratory: Normal inspiratory effort Cardiovascular: Normal rate GI: nondistended skin: Multiple scabs and excoriations with no crusting over fingers, forearms, forehead       Excoriations, erythematous rash posterior hairline  Musculoskeletal: no deformities Neurologic: Alert & oriented x 3, no focal neuro deficits Psychiatric: Speech and behavior appropriate   ED Course   Medications - No data to display  Orders Placed This Encounter  Procedures   Nursing Communication Please set up with a PCP prior to discharge    Please set up with a PCP prior to discharge    Standing Status:   Standing    Number of Occurrences:   1    No results found for this or any previous visit (from the past 24 hours). No results found.  ED Clinical Impression  1. Scabies   2. Scabies exposure      ED Assessment/Plan     1.  Rash, likely scabies.  Given known exposure to scabies, concern for scabies infestation.  Home with  permethrin with refills as she states the entire family has this.  Claritin or Zyrtec, and if this does not work, then Atarax.  She declined prednisone due to diabetes.  No evidence of secondary infection.  Deferring antibiotics today.  Will set up with a PCP prior to discharge.  2.  Elevated blood-pressure reading with diagnosis of hypertension.  She is currently asymptomatic.  States that  she has never been prescribed blood pressure medications in the past.  Will have her monitor this.  Will provide to ER hypertensive emergency return precautions.  Follow-up here in 2 weeks or with PCP as scheduled if blood pressure stays elevated for reevaluation.  Discussed, MDM, treatment plan, and plan for follow-up with patient. Discussed sn/sx that should prompt return to the ED. patient agrees with plan.   Meds ordered this encounter  Medications   DISCONTD: hydrOXYzine (ATARAX) 25 MG tablet    Sig: Take 1 tablet (25 mg total) by mouth every 6 (six) hours as needed.    Dispense:  20 tablet    Refill:  0   permethrin (ELIMITE) 5 % cream    Sig: Apply from chin down, leave on for 8-14 hours, rinse. Repeat in 1 week    Dispense:  60 g    Refill:  2   hydrOXYzine (ATARAX) 25 MG tablet    Sig: Take 1 tablet (25 mg total) by mouth every 6 (six) hours as needed.    Dispense:  20 tablet    Refill:  0      *This clinic note was created using Scientist, clinical (histocompatibility and immunogenetics). Therefore, there may be occasional mistakes despite careful proofreading.  ?    Domenick Gong, MD 08/05/23 [phone]

## 2023-08-11 ENCOUNTER — Ambulatory Visit: Admitting: Student
